# Patient Record
Sex: Female | Born: 1970 | Race: White | Hispanic: No | Marital: Married | Smoking: Never smoker
Health system: Southern US, Community
[De-identification: ages and names within clinical notes are randomized; demographics above are authoritative.]

## PROBLEM LIST (undated history)

## (undated) DIAGNOSIS — Z148 Genetic carrier of other disease: Secondary | ICD-10-CM

## (undated) DIAGNOSIS — Z9889 Other specified postprocedural states: Secondary | ICD-10-CM

## (undated) DIAGNOSIS — E039 Hypothyroidism, unspecified: Secondary | ICD-10-CM

## (undated) DIAGNOSIS — Z87442 Personal history of urinary calculi: Secondary | ICD-10-CM

## (undated) DIAGNOSIS — K5792 Diverticulitis of intestine, part unspecified, without perforation or abscess without bleeding: Secondary | ICD-10-CM

## (undated) DIAGNOSIS — J45909 Unspecified asthma, uncomplicated: Secondary | ICD-10-CM

## (undated) DIAGNOSIS — F419 Anxiety disorder, unspecified: Secondary | ICD-10-CM

## (undated) DIAGNOSIS — N809 Endometriosis, unspecified: Secondary | ICD-10-CM

## (undated) DIAGNOSIS — R519 Headache, unspecified: Secondary | ICD-10-CM

## (undated) DIAGNOSIS — R112 Nausea with vomiting, unspecified: Secondary | ICD-10-CM

## (undated) DIAGNOSIS — F988 Other specified behavioral and emotional disorders with onset usually occurring in childhood and adolescence: Secondary | ICD-10-CM

## (undated) DIAGNOSIS — K769 Liver disease, unspecified: Secondary | ICD-10-CM

## (undated) DIAGNOSIS — Q438 Other specified congenital malformations of intestine: Secondary | ICD-10-CM

## (undated) DIAGNOSIS — E079 Disorder of thyroid, unspecified: Secondary | ICD-10-CM

## (undated) DIAGNOSIS — K269 Duodenal ulcer, unspecified as acute or chronic, without hemorrhage or perforation: Secondary | ICD-10-CM

## (undated) HISTORY — PX: LIVER BIOPSY: SHX301

## (undated) HISTORY — PX: ABDOMINAL SURGERY: SHX537

## (undated) HISTORY — PX: APPENDECTOMY: SHX54

## (undated) HISTORY — PX: ABDOMINAL HYSTERECTOMY: SHX81

## (undated) HISTORY — PX: CHOLECYSTECTOMY: SHX55

---

## 1997-07-02 HISTORY — PX: COLONOSCOPY: SHX5424

## 2006-02-04 ENCOUNTER — Ambulatory Visit: Payer: Self-pay | Admitting: Ophthalmology

## 2006-02-11 ENCOUNTER — Ambulatory Visit: Payer: Self-pay | Admitting: Surgery

## 2006-07-11 ENCOUNTER — Inpatient Hospital Stay (HOSPITAL_COMMUNITY): Admission: AD | Admit: 2006-07-11 | Discharge: 2006-07-11 | Payer: Self-pay | Admitting: Family Medicine

## 2006-07-15 ENCOUNTER — Ambulatory Visit: Payer: Self-pay | Admitting: Gynecology

## 2006-07-19 ENCOUNTER — Encounter: Admission: RE | Admit: 2006-07-19 | Discharge: 2006-07-19 | Payer: Self-pay | Admitting: Gynecology

## 2007-01-20 ENCOUNTER — Emergency Department: Payer: Self-pay

## 2007-01-20 ENCOUNTER — Other Ambulatory Visit: Payer: Self-pay

## 2007-05-23 ENCOUNTER — Ambulatory Visit: Payer: Self-pay | Admitting: Otolaryngology

## 2007-07-30 ENCOUNTER — Encounter: Admission: RE | Admit: 2007-07-30 | Discharge: 2007-07-30 | Payer: Self-pay | Admitting: Neurology

## 2011-04-27 ENCOUNTER — Ambulatory Visit: Payer: Self-pay | Admitting: Gastroenterology

## 2011-05-17 ENCOUNTER — Ambulatory Visit: Payer: Self-pay | Admitting: Surgery

## 2011-05-22 ENCOUNTER — Ambulatory Visit: Payer: Self-pay | Admitting: Surgery

## 2011-05-31 ENCOUNTER — Observation Stay: Payer: Self-pay | Admitting: Surgery

## 2011-10-23 ENCOUNTER — Ambulatory Visit: Payer: Self-pay | Admitting: Family Medicine

## 2011-10-31 ENCOUNTER — Ambulatory Visit: Payer: Self-pay | Admitting: Family Medicine

## 2012-05-21 ENCOUNTER — Ambulatory Visit: Payer: Self-pay | Admitting: Unknown Physician Specialty

## 2012-06-13 ENCOUNTER — Ambulatory Visit: Payer: Self-pay | Admitting: Unknown Physician Specialty

## 2012-10-29 ENCOUNTER — Ambulatory Visit: Payer: Self-pay | Admitting: Unknown Physician Specialty

## 2012-10-30 HISTORY — PX: THYROIDECTOMY: SHX17

## 2012-11-18 ENCOUNTER — Ambulatory Visit: Payer: Self-pay | Admitting: Unknown Physician Specialty

## 2012-11-18 LAB — CALCIUM: Calcium, Total: 8.8 mg/dL (ref 8.5–10.1)

## 2012-12-11 ENCOUNTER — Observation Stay: Payer: Self-pay | Admitting: Internal Medicine

## 2012-12-11 LAB — URINALYSIS, COMPLETE
Bilirubin,UR: NEGATIVE
Blood: NEGATIVE
Blood: NEGATIVE
Glucose,UR: NEGATIVE mg/dL (ref 0–75)
Nitrite: NEGATIVE
Nitrite: NEGATIVE
Ph: 6 (ref 4.5–8.0)
Ph: 6 (ref 4.5–8.0)
RBC,UR: <1 /HPF (ref 0–5)
Squamous Epithelial: 2
Squamous Epithelial: <1

## 2012-12-11 LAB — COMPREHENSIVE METABOLIC PANEL
Albumin: 4 g/dL (ref 3.4–5.0)
Alkaline Phosphatase: 82 U/L (ref 50–136)
Anion Gap: 4 — ABNORMAL LOW (ref 7–16)
Chloride: 105 mmol/L (ref 98–107)
Creatinine: 0.76 mg/dL (ref 0.60–1.30)
Potassium: 4.9 mmol/L (ref 3.5–5.1)
SGOT(AST): 43 U/L — ABNORMAL HIGH (ref 15–37)

## 2012-12-11 LAB — TROPONIN I: Troponin-I: <0.02 ng/mL

## 2012-12-11 LAB — CBC
HGB: 13.6 g/dL (ref 12.0–16.0)
MCH: 30.8 pg (ref 26.0–34.0)
Platelet: 217 10*3/uL (ref 150–440)
RBC: 4.4 10*6/uL (ref 3.80–5.20)
RDW: 12.7 % (ref 11.5–14.5)
WBC: 8.4 10*3/uL (ref 3.6–11.0)

## 2012-12-11 LAB — CK TOTAL AND CKMB (NOT AT ARMC): CK, Total: 65 U/L (ref 21–215)

## 2012-12-12 LAB — MAGNESIUM: Magnesium: 1.9 mg/dL

## 2012-12-31 ENCOUNTER — Other Ambulatory Visit: Payer: Self-pay | Admitting: Unknown Physician Specialty

## 2012-12-31 LAB — MAGNESIUM: Magnesium: 2.2 mg/dL

## 2013-01-20 ENCOUNTER — Other Ambulatory Visit: Payer: Self-pay | Admitting: Unknown Physician Specialty

## 2014-03-22 ENCOUNTER — Ambulatory Visit: Payer: Self-pay | Admitting: Family Medicine

## 2014-04-23 ENCOUNTER — Ambulatory Visit: Payer: Self-pay | Admitting: Gastroenterology

## 2014-04-23 LAB — PROTIME-INR
INR: 1.1
PROTHROMBIN TIME: 13.5 s (ref 11.5–14.7)

## 2014-04-23 LAB — PLATELET COUNT: PLATELETS: 216 10*3/uL (ref 150–440)

## 2014-07-02 HISTORY — PX: COLONOSCOPY: SHX174

## 2014-10-22 NOTE — H&P (Signed)
PATIENT NAME:  Amber Booth, Amber Booth MR#:  [id number] DATE OF BIRTH:  1971/01/21  DATE OF ADMISSION:  12/11/2012  PRIMARY CARE PHYSICIAN: Jerl Mina, MD   CHIEF COMPLAINT: Passed out, had episodes of seizures.   HISTORY OF PRESENT ILLNESS: Amber Booth is a 44 year old white female with past medical history of nephrolithiasis with diagnosis of thyroid nodules with concern about malignancy the patient underwent thyroidectomy 3 weeks back. Since surgery the patient has been feeling dizzy and fatigued with decreased appetite. On Monday the patient got up to go to the bathroom. As she walked felt dizzy, fell down to the floor, body was stiffened. Husband was concern about if the patient had any seizures. The patient regained consciousness back. On Tuesday night, while the patient was at rest, developed similar symptoms of severe dizziness followed by jerking movements in both upper and lower limbs.  Lasted about a few seconds. The patient was unresponsive and confused for a few minutes and returned consciousness. Concerning this the patient was brought to the Emergency Department. Work-up in the Emergency Department with EKG and cardiac enzymes was unremarkable. CT head without contrast did not show any acute intracranial abnormality. The patient's calcium level was 9.3. TSH 2.42. The patient states she has been feeling severe dizziness even at rest. The patient continues to drink fluids, has been tolerating diet well.   PAST MEDICAL HISTORY: 1.  Nephrolithiasis. 2.  Thyroid nodule, status post thyroidectomy.  3.  Cholecystectomy.  4.  Hysterectomy with left salpingo-oophorectomy.  5.  Appendectomy.  6.  Surgery for pituitary microadenoma in 2006.  7.  Gastroesophageal reflux disease. 8.  Anemia. 9.  Migraine headaches.  ALLERGIES:  1.  STADOL.  2.  DEMEROL.  3.  PERCOCET.  4.  NARCOTIC ANALGESICS. 5.  CODEINE.  HOME MEDICATIONS: 1.  Tramadol 50 mg 1 to 2 tablets every 4 hours as needed.  2.  Protonix  20 mg 2 times a day.  3.  Os-Cal 1 tablet 2 times a day.  4.  Levothyroxine 25 mg once a day.  5.  Concerta 37.5 mg orally once a day.   SOCIAL HISTORY: No history of smoking, drinking, alcohol or using illicit drugs. Married.  Lives with her husband. 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  FAMILY HISTORY: Positive for diabetes mellitus, heart disease, clotting problems, hypertension, lung disease, stroke, ovarian cancer and lung cancer.  REVIEW OF SYSTEMS: CONSTITUTIONAL: Severe generalized weakness, fatigue, dizziness.  EYES: No blurred vision.  ENT: No tinnitus. No decreased hearing. No sore throat. RESPIRATORY: No cough, shortness of breath.  CARDIOVASCULAR: No chest pain, palpitations. No pedal edema.  GASTROINTESTINAL: No nausea, vomiting, abdominal pain.  GENITOURINARY: No dysuria or hematuria.  ENDOCRINE: No polyuria or polydipsia.  HEMATOLOGIC: No easy bruising or bleeding.  SKIN: No rash or lesions.  MUSCULOSKELETAL: No joint pains and aches. NEUROLOGIC:  No weakness or numbness in any part of the body.   PHYSICAL EXAMINATION: GENERAL: This is a well-built, well-nourished, age-appropriate female lying down in the bed, not in distress.  VITAL SIGNS: Temperature 97.6, pulse 67, blood pressure 108/71, respiratory rate 20, oxygen saturations 99% on room air.  HEENT: Head normocephalic, atraumatic. There is no sclerae icterus. Conjunctivae normal. Pupils equal and react to light. Mucous membranes moist.  NECK: Supple. No lymphadenopathy. No JVD. No carotid bruit. Surgical scar well healed. LUNGS:  Chest has no focal tenderness. Bilaterally clear to auscultation.  HEART: S1 and S2 regular. No murmurs are heard. No pedal edema. Pulses 2+ in  both lower extremities.  ABDOMEN: Bowel sounds present. Soft, nontender, nondistended. No hepatosplenomegaly.  SKIN: No rash or lesions.  MUSCULOSKELETAL: Good range of motion in all the extremities.  LYMPH:  No axillary or inguinal lymphadenopathy.   NEUROLOGIC: The patient is alert and oriented to place, person and time. Cranial nerves II through XII intact. No nystagmus. Motor 5/5 in upper and lower extremities.   LABORATORY AND DIAGNOSTICS:  CBC: WBC 8.4, hemoglobin 13.6, platelet count 217. CMP is completely within normal limits.   UA negative for nitrites and leukocyte esterase.   CT head without contrast. No acute intracranial abnormality.  Troponin is less than 0.02. TSH is 2.42.   ASSESSMENT AND PLAN: Amber Booth is a 44 year old female status post thyroidectomy 3 weeks back who comes to the Emergency Department with dizziness and questionable episodes of seizures.  1.  Syncope.  Differential diagnosis dehydration from decreased p.o. intake, arrhythmias. EKG is normal sinus rhythm. The patient does not have any electrolyte imbalances at this time. Also orthostatic hypotension. Will check the orthostatic blood pressures. The other possibility of autonomic dysfunction. The patient had history of pituitary tumor resection. There is a possibility that the patient could develop seizures. Admit the patient to the monitored bed. We will obtain echocardiogram, MRI of the brain, EEG.  If these come back to be negative, may also consider getting a tilt table test to evaluate autonomic dysfunction. Will involve physical therapy to evaluate for the patient's vestibular dysfunction. The patient does not have any nystagmus.  2.  Status post thyroidectomy for thyroid nodules.  The patient has pituitary tumor and thyroid nodules.  3.  Nephrolithiasis.  No current issues.  4.  Seizures. Will obtain EEG and follow up.  5.  Keep the patient on deep vein thrombosis prophylaxis with Lovenox. ____________________________ Susa GriffinsPadmaja Miko Markwood, MD pv:sb D: 12/11/2012 08:32:25 ET T: 12/11/2012 09:18:02 ET JOB#: [id number]  cc: Susa GriffinsPadmaja Lucindia Lemley, MD, <Dictator> Rhona LeavensJames F. Burnett ShengHedrick, MD Clerance LavPADMAJA Terrilee Dudzik MD ELECTRONICALLY SIGNED 12/12/2012 8:04

## 2014-10-22 NOTE — Discharge Summary (Signed)
PATIENT NAME:  Amber Booth, Amber Booth MR#:  [id number] DATE OF BIRTH:  Oct 21, 1970  DATE OF ADMISSION:  12/11/2012 DATE OF DISCHARGE:  12/12/2012  PRESENTING COMPLAINT: Syncope and collapse.   DISCHARGE DIAGNOSES: 1.  Seizure disorder, new-onset.  2.  Status post history of total thyroidectomy, secondary to multinodular goiter.   CODE STATUS: FULL CODE.   MEDICATIONS: 1.  Os-Cal/vitamin D (Dictation Anomaly) <<MISSING TEXT>> .  2.  Concerta 54 mg p.o. daily in the morning.  3.  Levoxyl 125 mcg p.o. before breakfast,  4.  Protonix 40 mg at bedtime as needed.  5.  Multivitamin daily.  6.  Tylenol 325 mg, 2 tablets every 4 hours.   DIET: Regular.   1.  Follow up with Dr. Renae FicklePaul, endocrinology, June 26th at 1 p.m., [place] office.  2.  The patient was advised no driving for 6 months.  3.  Follow up with Dr. Burnett ShengHedrick in 1 to 2 weeks.   CONSULTATIONS: [facility]: Dr. Caron Presumeuben.   Magnesium 1.9. TSH 2.  UA negative for UTI.   Cardiac enzymes x 3 negative.   Ionized calcium was 5.1.    EKG showed sinus rhythm with short P-R interval.   Magnesium was 1.8.   Echo Doppler was within normal limits.   MRI of the brain was within normal limits; bifrontal sinusitis.   EEG was reported as within normal limits.   BRIEF SUMMARY OF HOSPITAL COURSE: Rudi HeapKaren Diesel is a 44 year old Caucasian female with no significant past medical history. Recently underwent a total thyroidectomy due to multinodular goiter. The patient came in with:   1.  The patient came in with syncope with collapse: Her husband witnessed both episodes. The patient started having some headache, felt dizzy, passed out and became stiff, had urinary incontinence on one of the episodes. A similar episode repeated again, without incontinence. [facility]y was consulted. Consultation was done with Dr. Caron Presumeuben. He diagnosed the patient had new-onset seizure, etiology unclear, but the patient was recommended to be on Keppra  500 b.i.d. MRI  brain was negative. The patient remained in sinus rhythm. Electrolytes were normal. An EEG was normal as well. She was also recommended not to drive for 6 months.   The patient wanted to wait and see. She was not interested in outpatient neurology second opinion, as well as was not interested in taking any antiseizure medicine.   2. Status post total thyroidectomy for multinodular goiter: Calcium, magnesium, TSH, calcium were normal. The patient was continued on her Synthroid and calcium supplements.  3.  History of migraine headaches: Hospital stay otherwise remained stable.   THE PATIENT REMAINED A FULL CODE.   Time spent: Forty minutes.     ____________________________ Wylie HailSona A. Allena KatzPatel, MD sap:dm D: 12/13/2012 13:14:29 ET T: 12/14/2012 09:16:07 ET JOB#: [id number]  cc: Letisia Schwalb A. Allena KatzPatel, MD, <Dictator> Rhona LeavensJames F. Burnett ShengHedrick, MD Dr. Merla Richesuben Bhakti B. Renae FicklePaul, MD

## 2014-10-22 NOTE — Discharge Summary (Signed)
PATIENT NAME:  Amber Booth, [name] MR#:  [id number] DATE OF BIRTH:  August 21, 1970  DATE OF ADMISSION:  12/11/2012 DATE OF DISCHARGE:  12/12/2012  PRESENTING COMPLAINT: Syncope and collapse.   DISCHARGE DIAGNOSES: 1.  Seizure disorder, new-onset.  2.  Status post history of total thyroidectomy, secondary to multinodular goiter.   CODE STATUS: FULL CODE.   MEDICATIONS: 1.  Os-Cal/vitamin D po daily 2.  Concerta 54 mg p.o. daily in the morning.  3.  Levoxyl 125 mcg p.o. before breakfast,  4.  Protonix 40 mg at bedtime as needed.  5.  Multivitamin daily.  6.  Tylenol 325 mg, 2 tablets every 4 hours.   DIET: Regular.   1.  Follow up with Dr. Renae FicklePaul, endocrinology, June 26th at 1 p.m., [place] office.  2.  The patient was advised no driving for 6 months.  3.  Follow up with Dr. Burnett ShengHedrick in 1 to 2 weeks.   CONSULTATIONS: [facility]: Dr. Caron Presumeuben.   Magnesium 1.9. TSH 2.  UA negative for UTI.   Cardiac enzymes x 3 negative.   Ionized calcium was 5.1.    EKG showed sinus rhythm with short P-R interval.   Magnesium was 1.8.   Echo Doppler was within normal limits.   MRI of the brain was within normal limits; bifrontal sinusitis.   EEG was reported as within normal limits.   BRIEF SUMMARY OF HOSPITAL COURSE: Rudi HeapKaren Girvan is a 44 year old Caucasian female with no significant past medical history. Recently underwent a total thyroidectomy due to multinodular goiter. The patient came in with:   1.  The patient came in with syncope with collapse: Her husband witnessed both episodes. The patient started having some headache, felt dizzy, passed out and became stiff, had urinary incontinence on one of the episodes. A similar episode repeated again, without incontinence. [facility] was consulted. Consultation was done with Dr. Caron Presumeuben. He diagnosed the patient had new-onset seizure, etiology unclear, but the patient was recommended to be on Keppra  500 b.i.d. MRI brain was negative. The patient  remained in sinus rhythm. Electrolytes were normal. An EEG was normal as well. She was also recommended not to drive for 6 months.   The patient wanted to wait and see. She was not interested in outpatient neurology second opinion, as well as was not interested in taking any antiseizure medicine.   2. Status post total thyroidectomy for multinodular goiter: Calcium, magnesium, TSH, calcium were normal. The patient was continued on her Synthroid and calcium supplements.  3.  History of migraine headaches: Hospital stay otherwise remained stable.   THE PATIENT REMAINED A FULL CODE.   Time spent: Forty minutes.     ____________________________ Wylie HailSona A. Allena KatzPatel, MD sap:dm D: 12/13/2012 13:14:00 ET T: 12/14/2012 09:16:07 ET JOB#: [id number]  cc: Rhona LeavensJames F. Burnett ShengHedrick, MD Dr. Merla Richesuben Bhakti B. Renae FicklePaul, MD Ananda Sitzer A. Allena KatzPatel, MD, <Dictator>    Willow OraSONA A Artasia Thang MD ELECTRONICALLY SIGNED 12/24/2012 14:19

## 2014-10-22 NOTE — Op Note (Signed)
PATIENT NAME:  Amber Booth, Amber Booth MR#:  [id number] DATE OF BIRTH:  1970-11-30  DATE OF PROCEDURE:  11/18/2012  PREOPERATIVE DIAGNOSIS: Multinodular goiter.  POSTOPERATIVE DIAGNOSIS: Multinodular goiter.  OPERATION PERFORMED: Total thyroidectomy with laryngeal nerve monitoring.  ATTENDING SURGEON: Amber Pokehapman T. Jery Hollern, MD  ASSISTANT: Amber BealSURGEONColbert Booth: Amber Booth.   OPERATIVE FINDINGS: Multinodular goiter.   DESCRIPTION OF PROCEDURE: Amber BraunKaren was identified in the holding area and taken to the operating room and placed in the supine position. After general endotracheal anesthesia with a laryngeal monitoring device, the neck was gently extended. An incision line was marked along a natural skin crease. A local anesthetic of 1% lidocaine with 1:100,000 units of epinephrine was used to inject along this, a total of 4 mL was used. With the neck prepped and draped sterilely, a 15-blade was used to incise down to and through the platysma muscle. Hemostasis was achieved using the Bovie cautery. The strap muscles were identified and gently divided in the midline. The left lobe of the gland was dissected first. The strap muscles were retracted laterally. There were several small feeding vessels which were divided using the Harmonic scalpel. The superior pole vessels were isolated and again divided by the Harmonic scalpel. The gland was then gently medialized. The superior and inferior parathyroid glands were identified and left intact on their vascular pedicles. As the gland was peeled medially, again, there were several vessels which were divided using the Harmonic scalpel. The recurrent laryngeal nerve was identified in the tracheoesophageal groove. This was stimulated and remained intact throughout the case. With the nerve and both parathyroids identified, the gland was then peeled off the anterior wall of the trachea, releasing Berry's ligament. With the left lobe dissected, the operation then proceeded on the right lobe. Again,  the strap muscles were retracted laterally. The superior pole vessels were isolated and divided using the Harmonic scalpel. As the gland was medialized, the superior and inferior parathyroid glands again were identified and left intact on their vascular pedicles. The recurrent laryngeal nerve was identified in the tracheoesophageal groove. This was also stimulated and remained intact throughout the case. The gland was then medialized and peeled off the anterior tracheal wall. The inferior vessels were divided using the Harmonic scalpel. Berry's ligament was released, and the gland was removed in its entirety from the anterior trachea. A stitch was placed in the right superior pole. The wound was then copiously irrigated with saline. Any small bleeding points were cauterized using the microbipolar. The recurrent laryngeal nerves again were identified and stimulated and remained intact. Two #10 TLS drains were brought out of the wound inferiorly. The strap muscles were reapproximated in the midline using 4-0 Vicryl. The platysmal layer was closed using 4-0 Vicryl. The subcutaneous tissues were closed using 4-0 Vicryl, and the skin was closed using Dermabond. A Tegaderm was placed for the drains. The patient was then returned to anesthesia, where she was awakened in the operating room and taken to recovery room in stable condition.   CULTURES: None.   SPECIMENS: Multinodular goiter.   ESTIMATED BLOOD LOSS: Less than 20 mL.  ____________________________ Amber Pokehapman T. Joniece Smotherman, MD Amber Booth D: 11/18/2012 09:20:04 ET T: 11/18/2012 09:48:44 ET JOB#: [id number]  cc: Amber Pokehapman T. Amber Matos, MD, <Dictator> Amber PokeHAPMAN T Fidelis Loth MD ELECTRONICALLY SIGNED 12/03/2012 8:35

## 2014-10-25 LAB — SURGICAL PATHOLOGY

## 2015-08-10 ENCOUNTER — Other Ambulatory Visit: Payer: Self-pay | Admitting: Internal Medicine

## 2015-08-10 DIAGNOSIS — H53482 Generalized contraction of visual field, left eye: Secondary | ICD-10-CM

## 2015-08-10 DIAGNOSIS — N643 Galactorrhea not associated with childbirth: Secondary | ICD-10-CM

## 2015-08-11 ENCOUNTER — Ambulatory Visit
Admission: RE | Admit: 2015-08-11 | Discharge: 2015-08-11 | Disposition: A | Discharge status: Home / Self Care | Payer: No Typology Code available for payment source | Source: Ambulatory Visit | Attending: Internal Medicine | Admitting: Internal Medicine

## 2015-08-11 DIAGNOSIS — N643 Galactorrhea not associated with childbirth: Secondary | ICD-10-CM | POA: Diagnosis present

## 2015-08-11 DIAGNOSIS — H53482 Generalized contraction of visual field, left eye: Secondary | ICD-10-CM | POA: Insufficient documentation

## 2015-08-11 MED ORDER — GADOBENATE DIMEGLUMINE 529 MG/ML IV SOLN
10.0000 mL | Freq: Once | INTRAVENOUS | Status: AC | PRN
  Administered 2015-08-11: 8 mL via INTRAVENOUS

## 2015-10-07 ENCOUNTER — Other Ambulatory Visit: Payer: Self-pay | Admitting: Ophthalmology

## 2015-10-07 DIAGNOSIS — H5789 Other specified disorders of eye and adnexa: Secondary | ICD-10-CM

## 2015-10-07 DIAGNOSIS — E0500 Thyrotoxicosis with diffuse goiter without thyrotoxic crisis or storm: Secondary | ICD-10-CM

## 2015-10-27 ENCOUNTER — Ambulatory Visit
Admission: RE | Admit: 2015-10-27 | Discharge: 2015-10-27 | Disposition: A | Discharge status: Home / Self Care | Payer: No Typology Code available for payment source | Source: Ambulatory Visit | Attending: Ophthalmology | Admitting: Ophthalmology

## 2015-10-27 DIAGNOSIS — E0500 Thyrotoxicosis with diffuse goiter without thyrotoxic crisis or storm: Secondary | ICD-10-CM | POA: Insufficient documentation

## 2015-10-27 DIAGNOSIS — E079 Disorder of thyroid, unspecified: Secondary | ICD-10-CM

## 2015-10-27 DIAGNOSIS — H539 Unspecified visual disturbance: Secondary | ICD-10-CM | POA: Insufficient documentation

## 2015-10-27 MED ORDER — GADOBENATE DIMEGLUMINE 529 MG/ML IV SOLN
20.0000 mL | Freq: Once | INTRAVENOUS | Status: AC | PRN
  Administered 2015-10-27: 16 mL via INTRAVENOUS

## 2016-02-29 ENCOUNTER — Other Ambulatory Visit
Admission: RE | Admit: 2016-02-29 | Discharge: 2016-02-29 | Disposition: A | Discharge status: Home / Self Care | Payer: Managed Care, Other (non HMO) | Source: Ambulatory Visit | Attending: Unknown Physician Specialty | Admitting: Unknown Physician Specialty

## 2016-02-29 DIAGNOSIS — M791 Myalgia: Secondary | ICD-10-CM | POA: Insufficient documentation

## 2016-02-29 LAB — CK: Total CK: 99 U/L (ref 38–234)

## 2016-03-18 ENCOUNTER — Inpatient Hospital Stay
Admission: EM | Admit: 2016-03-18 | Discharge: 2016-03-20 | DRG: 391 | Disposition: A | Discharge status: Home / Self Care | Payer: Managed Care, Other (non HMO) | Attending: General Surgery | Admitting: General Surgery

## 2016-03-18 ENCOUNTER — Emergency Department: Payer: Managed Care, Other (non HMO)

## 2016-03-18 ENCOUNTER — Encounter: Payer: Self-pay | Admitting: Emergency Medicine

## 2016-03-18 DIAGNOSIS — K5732 Diverticulitis of large intestine without perforation or abscess without bleeding: Principal | ICD-10-CM | POA: Diagnosis present

## 2016-03-18 DIAGNOSIS — K5909 Other constipation: Secondary | ICD-10-CM | POA: Diagnosis present

## 2016-03-18 DIAGNOSIS — Z9049 Acquired absence of other specified parts of digestive tract: Secondary | ICD-10-CM | POA: Diagnosis not present

## 2016-03-18 DIAGNOSIS — Z90710 Acquired absence of both cervix and uterus: Secondary | ICD-10-CM | POA: Diagnosis not present

## 2016-03-18 DIAGNOSIS — Z885 Allergy status to narcotic agent status: Secondary | ICD-10-CM | POA: Diagnosis not present

## 2016-03-18 DIAGNOSIS — R109 Unspecified abdominal pain: Secondary | ICD-10-CM

## 2016-03-18 DIAGNOSIS — E079 Disorder of thyroid, unspecified: Secondary | ICD-10-CM | POA: Diagnosis present

## 2016-03-18 DIAGNOSIS — K831 Obstruction of bile duct: Secondary | ICD-10-CM | POA: Diagnosis present

## 2016-03-18 DIAGNOSIS — K5792 Diverticulitis of intestine, part unspecified, without perforation or abscess without bleeding: Secondary | ICD-10-CM

## 2016-03-18 HISTORY — DX: Diverticulitis of intestine, part unspecified, without perforation or abscess without bleeding: K57.92

## 2016-03-18 HISTORY — DX: Disorder of thyroid, unspecified: E07.9

## 2016-03-18 LAB — URINALYSIS COMPLETE WITH MICROSCOPIC (ARMC ONLY)
BACTERIA UA: NONE SEEN
Bilirubin Urine: NEGATIVE
Glucose, UA: NEGATIVE mg/dL
HGB URINE DIPSTICK: NEGATIVE
Ketones, ur: NEGATIVE mg/dL
LEUKOCYTES UA: NEGATIVE
NITRITE: POSITIVE — AB
PH: 7 (ref 5.0–8.0)
PROTEIN: NEGATIVE mg/dL
Specific Gravity, Urine: 1.005 (ref 1.005–1.030)
WBC UA: NONE SEEN WBC/hpf (ref 0–5)

## 2016-03-18 LAB — COMPREHENSIVE METABOLIC PANEL
ALBUMIN: 4 g/dL (ref 3.5–5.0)
ALT: 536 U/L — ABNORMAL HIGH (ref 14–54)
ANION GAP: 9 (ref 5–15)
AST: 251 U/L — AB (ref 15–41)
Alkaline Phosphatase: 114 U/L (ref 38–126)
BUN: 12 mg/dL (ref 6–20)
CHLORIDE: 104 mmol/L (ref 101–111)
CO2: 24 mmol/L (ref 22–32)
Calcium: 8.8 mg/dL — ABNORMAL LOW (ref 8.9–10.3)
Creatinine, Ser: 0.63 mg/dL (ref 0.44–1.00)
GFR calc Af Amer: >60 mL/min (ref >60)
Glucose, Bld: 96 mg/dL (ref 65–99)
POTASSIUM: 3.3 mmol/L — AB (ref 3.5–5.1)
Sodium: 137 mmol/L (ref 135–145)
TOTAL PROTEIN: 6.9 g/dL (ref 6.5–8.1)
Total Bilirubin: 1.3 mg/dL — ABNORMAL HIGH (ref 0.3–1.2)

## 2016-03-18 LAB — CBC
HEMATOCRIT: 39.9 % (ref 35.0–47.0)
HEMOGLOBIN: 13.7 g/dL (ref 12.0–16.0)
MCH: 30.8 pg (ref 26.0–34.0)
MCHC: 34.3 g/dL (ref 32.0–36.0)
MCV: 89.7 fL (ref 80.0–100.0)
Platelets: 197 10*3/uL (ref 150–440)
RBC: 4.45 MIL/uL (ref 3.80–5.20)
RDW: 13.4 % (ref 11.5–14.5)
WBC: 13.9 10*3/uL — AB (ref 3.6–11.0)

## 2016-03-18 LAB — LIPASE, BLOOD: LIPASE: 25 U/L (ref 11–51)

## 2016-03-18 MED ORDER — MORPHINE SULFATE (PF) 2 MG/ML IV SOLN
2.0000 mg | Freq: Once | INTRAVENOUS | Status: DC
  Filled 2016-03-18: qty 1

## 2016-03-18 MED ORDER — MORPHINE SULFATE (PF) 2 MG/ML IV SOLN
2.0000 mg | Freq: Once | INTRAVENOUS | Status: AC
  Administered 2016-03-18: 2 mg via INTRAVENOUS
  Filled 2016-03-18: qty 1

## 2016-03-18 MED ORDER — MORPHINE SULFATE (PF) 4 MG/ML IV SOLN
4.0000 mg | Freq: Once | INTRAVENOUS | Status: AC
  Administered 2016-03-18: 4 mg via INTRAVENOUS
  Filled 2016-03-18: qty 1

## 2016-03-18 MED ORDER — PIPERACILLIN-TAZOBACTAM 3.375 G IVPB
3.3750 g | Freq: Once | INTRAVENOUS | Status: AC
  Administered 2016-03-18: 3.375 g via INTRAVENOUS
  Filled 2016-03-18: qty 50

## 2016-03-18 MED ORDER — IOPAMIDOL (ISOVUE-300) INJECTION 61%
100.0000 mL | Freq: Once | INTRAVENOUS | Status: AC | PRN
  Administered 2016-03-18: 100 mL via INTRAVENOUS

## 2016-03-18 MED ORDER — SODIUM CHLORIDE 0.9 % IV BOLUS (SEPSIS)
1000.0000 mL | Freq: Once | INTRAVENOUS | Status: AC
  Administered 2016-03-18: 1000 mL via INTRAVENOUS

## 2016-03-18 MED ORDER — IOPAMIDOL (ISOVUE-300) INJECTION 61%
30.0000 mL | Freq: Once | INTRAVENOUS | Status: AC | PRN
  Administered 2016-03-18: 30 mL via ORAL

## 2016-03-18 MED ORDER — ONDANSETRON HCL 4 MG/2ML IJ SOLN
4.0000 mg | Freq: Once | INTRAMUSCULAR | Status: AC
  Administered 2016-03-18: 4 mg via INTRAVENOUS
  Filled 2016-03-18: qty 2

## 2016-03-18 NOTE — ED Notes (Signed)
CT called, pt finished contrast.

## 2016-03-18 NOTE — ED Triage Notes (Deleted)
Pt reports lower quadrant abdominal pain that started yesterday but has worsened since.  Pt states her pain feels similar to how it did when she had an abscess in her abdomen. Pt states she is not passing gas but did have a small BM this morning.  Pt does have a GI doctor.

## 2016-03-18 NOTE — ED Provider Notes (Signed)
[facility] Emergency Department Provider Note   ____________________________________________   First MD Initiated Contact with Patient 03/18/16 1841     (approximate)  I have reviewed the triage vital signs and the nursing notes.   HISTORY  Chief Complaint Abdominal Pain    HPI JACARIA COLBURN is a 45 y.o. female again experiencing left lower quadrant abdominal pain yesterday. Severe, worsening throughout the day, associated with any fever or chills. Reports it feels the same as prior "diverticulitis".  Some nausea but no vomiting. No diarrhea. Reports pain is made much worse by any bump in the car trying to walk. Previous symptoms with similar.  Prior hysterectomy, cholecystectomy, appendectomy.   Past Medical History:  Diagnosis Date  . Diverticulitis   . Thyroid disease     There are no active problems to display for this patient.   Past Surgical History:  Procedure Laterality Date  . ABDOMINAL HYSTERECTOMY    . ABDOMINAL SURGERY    . THYROIDECTOMY      Prior to Admission medications   Not on File    Allergies Codeine; Demerol [meperidine]; and Oxycodone  History reviewed. No pertinent family history.  Social History Social History  Substance Use Topics  . Smoking status: Never Smoker  . Smokeless tobacco: Never Used  . Alcohol use No    Review of Systems Constitutional: No fever/chills Eyes: No visual changes. ENT: No sore throat. Cardiovascular: Denies chest pain. Respiratory: Denies shortness of breath. Gastrointestinal: Severe left lower abdominal pain  No diarrhea.  No constipation. Genitourinary: Negative for dysuria. Musculoskeletal: Negative for back pain. Skin: Negative for rash. Neurological: Negative for headaches, focal weakness or numbness.  10-point ROS otherwise negative.  ____________________________________________   PHYSICAL EXAM:  VITAL SIGNS: ED Triage Vitals [03/18/16 1835]  Enc Vitals  Group     BP 120/76     Pulse Rate 85     Resp 18     Temp 97.6 F (36.4 C)     Temp Source Oral     SpO2 100 %     Weight 171 lb (77.6 kg)     Height 5\' 8"  (1.727 m)     Head Circumference      Peak Flow      Pain Score 9     Pain Loc      Pain Edu?      Excl. in GC?     Constitutional: Alert and oriented. Well appearing But appears in pain, holding her lower abdomen. Eyes: Conjunctivae are normal. PERRL. EOMI. Head: Atraumatic. Nose: No congestion/rhinnorhea. Mouth/Throat: Mucous membranes are moist.  Oropharynx non-erythematous. Neck: No stridor.   Cardiovascular: Normal rate, regular rhythm. Grossly normal heart sounds.  Good peripheral circulation. Respiratory: Normal respiratory effort.  No retractions. Lungs CTAB. Gastrointestinal: Soft and moderate to severe tenderness with rebound peritonitis in the left lower quadrant. Pain to palpation in the right lower quadrant refers pain to the left. Negative Murphy. No upper abdominal pain. No distention. No abdominal bruits. No CVA tenderness. Musculoskeletal: No lower extremity tenderness nor edema.  No joint effusions. Neurologic:  Normal speech and language. No gross focal neurologic deficits are appreciated. Skin:  Skin is warm, dry and intact. No rash noted. Psychiatric: Mood and affect are normal. Speech and behavior are normal.  ____________________________________________   LABS (all labs ordered are listed, but only abnormal results are displayed)  Labs Reviewed  COMPREHENSIVE METABOLIC PANEL - Abnormal; Notable for the following:  Result Value   Potassium 3.3 (*)    Calcium 8.8 (*)    AST 251 (*)    ALT 536 (*)    Total Bilirubin 1.3 (*)    All other components within normal limits  CBC - Abnormal; Notable for the following:    WBC 13.9 (*)    All other components within normal limits  URINALYSIS COMPLETEWITH MICROSCOPIC ([facility] ONLY) - Abnormal; Notable for the following:    Color, Urine AMBER (*)     APPearance CLEAR (*)    Nitrite POSITIVE (*)    Squamous Epithelial / LPF 0-5 (*)    All other components within normal limits  LIPASE, BLOOD   ____________________________________________  EKG   ____________________________________________  RADIOLOGY  Ct Abdomen Pelvis W Contrast  Result Date: 03/18/2016 CLINICAL DATA:  Acute onset of lower abdominal pain. Initial encounter. EXAM: CT ABDOMEN AND PELVIS WITH CONTRAST TECHNIQUE: Multidetector CT imaging of the abdomen and pelvis was performed using the standard protocol following bolus administration of intravenous contrast. CONTRAST:  100mL ISOVUE-300 IOPAMIDOL (ISOVUE-300) INJECTION 61% COMPARISON:  CT of the abdomen and pelvis from 03/22/2014 FINDINGS: Lower chest: The visualized lung bases are grossly clear. The visualized portions of the mediastinum are unremarkable. Hepatobiliary: Intrahepatic biliary duct dilatation likely remains within normal limits status post cholecystectomy, though somewhat more prominent than in 2015. Clips are noted at the gallbladder fossa. The common bile duct is grossly unremarkable. Pancreas: The pancreas is within normal limits. Spleen: The spleen is unremarkable in appearance. Adrenals/Urinary Tract: The adrenal glands are unremarkable in appearance. The kidneys are within normal limits. There is no evidence of hydronephrosis. No renal or ureteral stones are identified. No perinephric stranding is seen. Stomach/Bowel: The stomach is unremarkable in appearance. The small bowel is within normal limits. The appendix is not visualized; there is no evidence for appendicitis. The colon is unremarkable in appearance Mild focal wall thickening is noted along the proximal to mid sigmoid colon, with associated soft tissue inflammation, concerning for an acute infectious or inflammatory colitis. Trace associated free fluid is seen within the pelvis. Vascular/Lymphatic: The abdominal aorta is unremarkable in appearance. The  inferior vena cava is grossly unremarkable. No retroperitoneal lymphadenopathy is seen. No pelvic sidewall lymphadenopathy is identified. Reproductive: The bladder is mildly distended and grossly unremarkable. The patient is status post hysterectomy. No suspicious adnexal masses are seen. The left ovary is unremarkable in appearance. Other: No additional soft tissue abnormalities are seen. Musculoskeletal: No acute osseous abnormalities are identified. The visualized musculature is unremarkable in appearance. IMPRESSION: 1. Mild focal wall thickening along the proximal to mid sigmoid colon, with associated soft tissue inflammation, concerning for an acute infectious or inflammatory colitis. 2. Trace associated free fluid noted within the pelvis. 3. Intrahepatic biliary duct dilatation is likely within normal limits status post cholecystectomy, though somewhat more prominent than in 2015. Electronically Signed   By: Roanna RaiderJeffery  Chang M.D.   On: 03/18/2016 21:33    ____________________________________________   PROCEDURES  Procedure(s) performed: None  Procedures  Critical Care performed: No  ____________________________________________   INITIAL IMPRESSION / ASSESSMENT AND PLAN / ED COURSE  Pertinent labs & imaging results that were available during my care of the patient were reviewed by me and considered in my medical decision making (see chart for details).  Differential diagnosis includes but is not limited to, abdominal perforation, aortic dissection, cholecystitis, appendicitis, diverticulitis, colitis, esophagitis/gastritis, kidney stone, pyelonephritis, urinary tract infection, aortic aneurysm. All are considered in decision and treatment plan.  Based upon the patient's presentation and risk factors, concerned about probable diverticulitis and we'll proceed with CT imaging to evaluate further for causes of pain.  ----------------------------------------- 10:21 PM on  03/18/2016 -----------------------------------------  Patient results appear consistent with diverticulitis, discussed case and patient seen by Dr. Lemar Livings in the ER. Patient will be admitted for pain control, IV antibiotics on the general surgery service.   Clinical Course     ____________________________________________   FINAL CLINICAL IMPRESSION(S) / ED DIAGNOSES  Final diagnoses:  Diverticulitis of sigmoid colon  Intractable abdominal pain      NEW MEDICATIONS STARTED DURING THIS VISIT:  New Prescriptions   No medications on file     Note:  This document was prepared using Dragon voice recognition software and may include unintentional dictation errors.     Sharyn Creamer, MD 03/18/16 2222

## 2016-03-18 NOTE — ED Notes (Signed)
Patient transported to CT 

## 2016-03-18 NOTE — H&P (Signed)
Amber Booth is an 45 y.o. female.   Chief Complaint: Abdominal pain. HPI: 45 y/o female well until 2 nights ago when her husband noted she had some report of mild abdominal discomfort. Well yesterday AM. Wilburn Mylar afternoon she developed pain in the left lower quadrant, similar to what she experienced with and episode of diverticulitis 2 years ago. (Reportedly with abscess not requiring drainage.  Was visiting her daughter in [place], and decided to defer medical assessment until this afternoon. Nausea, dry heaves. No emesis.  Reports long history of " constipation".  Colonoscopy 2 years ago w/ Loistine Simas, MD after diverticulitis episode.  Had been using Miralax and Citracel intermittently to minimize hard stools.  Last BM this AM: scybala like per description. Does not linger in the toilet for her bowel movements.    Past Medical History:  Diagnosis Date  . Diverticulitis   . Thyroid disease     Past Surgical History:  Procedure Laterality Date  . ABDOMINAL HYSTERECTOMY    . ABDOMINAL SURGERY    . THYROIDECTOMY      History reviewed. No pertinent family history. Social History:  reports that she has never smoked. She has never used smokeless tobacco. She reports that she does not drink alcohol. Her drug history is not on file.  Allergies:  Allergies  Allergen Reactions  . Codeine   . Demerol [Meperidine]   . Oxycodone      (Not in a hospital admission)  Results for orders placed or performed during the hospital encounter of 03/18/16 (from the past 48 hour(s))  Lipase, blood     Status: None   Collection Time: 03/18/16  7:20 PM  Result Value Ref Range   Lipase 25 11 - 51 U/L  Comprehensive metabolic panel     Status: Abnormal   Collection Time: 03/18/16  7:20 PM  Result Value Ref Range   Sodium 137 135 - 145 mmol/L   Potassium 3.3 (L) 3.5 - 5.1 mmol/L   Chloride 104 101 - 111 mmol/L   CO2 24 22 - 32 mmol/L   Glucose, Bld 96 65 - 99 mg/dL   BUN 12 6 - 20 mg/dL   Creatinine, Ser 0.63 0.44 - 1.00 mg/dL   Calcium 8.8 (L) 8.9 - 10.3 mg/dL   Total Protein 6.9 6.5 - 8.1 g/dL   Albumin 4.0 3.5 - 5.0 g/dL   AST 251 (H) 15 - 41 U/L   ALT 536 (H) 14 - 54 U/L   Alkaline Phosphatase 114 38 - 126 U/L   Total Bilirubin 1.3 (H) 0.3 - 1.2 mg/dL   GFR calc non Af Amer >60 >60 mL/min   GFR calc Af Amer >60 >60 mL/min    Comment: (NOTE) The eGFR has been calculated using the CKD EPI equation. This calculation has not been validated in all clinical situations. eGFR's persistently <60 mL/min signify possible Chronic Kidney Disease.    Anion gap 9 5 - 15  CBC     Status: Abnormal   Collection Time: 03/18/16  7:20 PM  Result Value Ref Range   WBC 13.9 (H) 3.6 - 11.0 K/uL   RBC 4.45 3.80 - 5.20 MIL/uL   Hemoglobin 13.7 12.0 - 16.0 g/dL   HCT 39.9 35.0 - 47.0 %   MCV 89.7 80.0 - 100.0 fL   MCH 30.8 26.0 - 34.0 pg   MCHC 34.3 32.0 - 36.0 g/dL   RDW 13.4 11.5 - 14.5 %   Platelets 197 150 - 440 K/uL  Urinalysis complete, with microscopic     Status: Abnormal   Collection Time: 03/18/16  7:20 PM  Result Value Ref Range   Color, Urine AMBER (A) YELLOW   APPearance CLEAR (A) CLEAR   Glucose, UA NEGATIVE NEGATIVE mg/dL   Bilirubin Urine NEGATIVE NEGATIVE   Ketones, ur NEGATIVE NEGATIVE mg/dL   Specific Gravity, Urine 1.005 1.005 - 1.030   Hgb urine dipstick NEGATIVE NEGATIVE   pH 7.0 5.0 - 8.0   Protein, ur NEGATIVE NEGATIVE mg/dL   Nitrite POSITIVE (A) NEGATIVE   Leukocytes, UA NEGATIVE NEGATIVE   RBC / HPF 0-5 0 - 5 RBC/hpf   WBC, UA NONE SEEN 0 - 5 WBC/hpf   Bacteria, UA NONE SEEN NONE SEEN   Squamous Epithelial / LPF 0-5 (A) NONE SEEN   Ct Abdomen Pelvis W Contrast  Result Date: 03/18/2016 CLINICAL DATA:  Acute onset of lower abdominal pain. Initial encounter. EXAM: CT ABDOMEN AND PELVIS WITH CONTRAST TECHNIQUE: Multidetector CT imaging of the abdomen and pelvis was performed using the standard protocol following bolus administration of intravenous  contrast. CONTRAST:  152m ISOVUE-300 IOPAMIDOL (ISOVUE-300) INJECTION 61% COMPARISON:  CT of the abdomen and pelvis from 03/22/2014 FINDINGS: Lower chest: The visualized lung bases are grossly clear. The visualized portions of the mediastinum are unremarkable. Hepatobiliary: Intrahepatic biliary duct dilatation likely remains within normal limits status post cholecystectomy, though somewhat more prominent than in 2015. Clips are noted at the gallbladder fossa. The common bile duct is grossly unremarkable. Pancreas: The pancreas is within normal limits. Spleen: The spleen is unremarkable in appearance. Adrenals/Urinary Tract: The adrenal glands are unremarkable in appearance. The kidneys are within normal limits. There is no evidence of hydronephrosis. No renal or ureteral stones are identified. No perinephric stranding is seen. Stomach/Bowel: The stomach is unremarkable in appearance. The small bowel is within normal limits. The appendix is not visualized; there is no evidence for appendicitis. The colon is unremarkable in appearance Mild focal wall thickening is noted along the proximal to mid sigmoid colon, with associated soft tissue inflammation, concerning for an acute infectious or inflammatory colitis. Trace associated free fluid is seen within the pelvis. Vascular/Lymphatic: The abdominal aorta is unremarkable in appearance. The inferior vena cava is grossly unremarkable. No retroperitoneal lymphadenopathy is seen. No pelvic sidewall lymphadenopathy is identified. Reproductive: The bladder is mildly distended and grossly unremarkable. The patient is status post hysterectomy. No suspicious adnexal masses are seen. The left ovary is unremarkable in appearance. Other: No additional soft tissue abnormalities are seen. Musculoskeletal: No acute osseous abnormalities are identified. The visualized musculature is unremarkable in appearance. IMPRESSION: 1. Mild focal wall thickening along the proximal to mid  sigmoid colon, with associated soft tissue inflammation, concerning for an acute infectious or inflammatory colitis. 2. Trace associated free fluid noted within the pelvis. 3. Intrahepatic biliary duct dilatation is likely within normal limits status post cholecystectomy, though somewhat more prominent than in 2015. Electronically Signed   By: JGarald BaldingM.D.   On: 03/18/2016 21:33    Review of Systems  Constitutional: Positive for fever. Negative for chills and diaphoresis.  HENT: Negative.   Eyes: Negative.   Respiratory: Negative.   Cardiovascular: Negative.   Gastrointestinal: Positive for abdominal pain, constipation and nausea. Negative for blood in stool and diarrhea.  Genitourinary: Positive for dysuria.  Musculoskeletal: Positive for myalgias (last week.).  Skin: Negative.        "Bug bite" last week treated with doxycycline.   Neurological: Negative.   Endo/Heme/Allergies:  Negative.   Psychiatric/Behavioral: Negative.     Blood pressure 127/73, pulse 89, temperature 97.6 F (36.4 C), temperature source Oral, resp. rate 20, height _0  (1.727 m), weight 171 lb (77.6 kg), SpO2 100 %. Physical Exam  Constitutional: She appears well-developed and well-nourished.  HENT:  Head: Normocephalic and atraumatic.  Eyes: EOM are normal. Pupils are equal, round, and reactive to light.  Neck:    Cardiovascular: Normal rate and regular rhythm.   Pulses:      Femoral pulses are 2+ on the right side, and 2+ on the left side.      Dorsalis pedis pulses are 2+ on the right side, and 2+ on the left side.       Posterior tibial pulses are 2+ on the right side, and 2+ on the left side.  No peripheral edema.   Respiratory: Effort normal and breath sounds normal.  Able to take deep breath without difficulty.   GI: Normal appearance and bowel sounds are normal. There is tenderness in the left lower quadrant. There is guarding. There is no rigidity and no rebound. Hernia confirmed negative  in the right inguinal area and confirmed negative in the left inguinal area.    Lymphadenopathy:       Right: No inguinal adenopathy present.       Left: No inguinal adenopathy present.  Neurological: She is alert. She has normal strength.  Skin: Skin is warm and dry.  Psychiatric: She has a normal mood and affect. Her speech is normal and behavior is normal. Judgment and thought content normal. Cognition and memory are normal.     Assessment/Plan Diverticulitis. Elevated LFT's. Recent treatment with doxycycline for possible tick exposure.  PLAN  Admit. IV antibiotics. Follow up LFT's with direct bilirubin.  Robert Bellow, MD 03/18/2016, 10:53 PM

## 2016-03-18 NOTE — ED Triage Notes (Signed)
Pt reports lower quadrant abdominal pain that started yesterday but has worsened since.  Pt states her pain feels similar to how it did when she had an abscess in her abdomen. Pt states she is not passing gas but did have a small BM this morning.  Pt does have a GI doctor.  

## 2016-03-19 LAB — HEPATIC FUNCTION PANEL
ALK PHOS: 136 U/L — AB (ref 38–126)
ALT: 583 U/L — AB (ref 14–54)
AST: 321 U/L — AB (ref 15–41)
Albumin: 3.2 g/dL — ABNORMAL LOW (ref 3.5–5.0)
BILIRUBIN DIRECT: 1.3 mg/dL — AB (ref 0.1–0.5)
BILIRUBIN TOTAL: 3.1 mg/dL — AB (ref 0.3–1.2)
Indirect Bilirubin: 1.8 mg/dL — ABNORMAL HIGH (ref 0.3–0.9)
Total Protein: 6 g/dL — ABNORMAL LOW (ref 6.5–8.1)

## 2016-03-19 LAB — CBC WITH DIFFERENTIAL/PLATELET
BASOS ABS: 0 10*3/uL (ref 0–0.1)
Basophils Relative: 0 %
Eosinophils Absolute: 0.1 10*3/uL (ref 0–0.7)
Eosinophils Relative: 1 %
HEMATOCRIT: 35.1 % (ref 35.0–47.0)
Hemoglobin: 12.3 g/dL (ref 12.0–16.0)
LYMPHS ABS: 1.8 10*3/uL (ref 1.0–3.6)
LYMPHS PCT: 23 %
MCH: 31.3 pg (ref 26.0–34.0)
MCHC: 34.9 g/dL (ref 32.0–36.0)
MCV: 89.6 fL (ref 80.0–100.0)
MONO ABS: 1 10*3/uL — AB (ref 0.2–0.9)
Monocytes Relative: 12 %
NEUTROS ABS: 5.1 10*3/uL (ref 1.4–6.5)
Neutrophils Relative %: 64 %
Platelets: 160 10*3/uL (ref 150–440)
RBC: 3.92 MIL/uL (ref 3.80–5.20)
RDW: 13.4 % (ref 11.5–14.5)
WBC: 8.1 10*3/uL (ref 3.6–11.0)

## 2016-03-19 MED ORDER — ENOXAPARIN SODIUM 40 MG/0.4ML ~~LOC~~ SOLN
40.0000 mg | SUBCUTANEOUS | Status: DC
Start: 2016-03-19 — End: 2016-03-19

## 2016-03-19 MED ORDER — CIPROFLOXACIN IN D5W 400 MG/200ML IV SOLN
400.0000 mg | Freq: Two times a day (BID) | INTRAVENOUS | Status: DC
  Administered 2016-03-19 – 2016-03-20 (×2): 400 mg via INTRAVENOUS
  Filled 2016-03-19 (×4): qty 200

## 2016-03-19 MED ORDER — ACETAMINOPHEN 10 MG/ML IV SOLN
1000.0000 mg | Freq: Once | INTRAVENOUS | Status: AC
  Administered 2016-03-19: 1000 mg via INTRAVENOUS
  Filled 2016-03-19: qty 100

## 2016-03-19 MED ORDER — MORPHINE SULFATE (PF) 2 MG/ML IV SOLN
2.0000 mg | INTRAVENOUS | Status: DC | PRN
  Administered 2016-03-19: 2 mg via INTRAVENOUS
  Filled 2016-03-19: qty 1

## 2016-03-19 MED ORDER — THYROID 146.25 MG PO TABS
146.2500 mg | ORAL_TABLET | ORAL | Status: DC
  Filled 2016-03-19: qty 1

## 2016-03-19 MED ORDER — PROMETHAZINE HCL 25 MG/ML IJ SOLN
12.5000 mg | Freq: Four times a day (QID) | INTRAMUSCULAR | Status: DC | PRN
  Administered 2016-03-19 (×2): 12.5 mg via INTRAVENOUS
  Filled 2016-03-19 (×2): qty 1

## 2016-03-19 MED ORDER — TRAMADOL HCL 50 MG PO TABS
100.0000 mg | ORAL_TABLET | ORAL | Status: DC | PRN
  Administered 2016-03-19: 100 mg via ORAL
  Filled 2016-03-19: qty 2

## 2016-03-19 MED ORDER — SODIUM CHLORIDE 0.9 % IV SOLN
1.0000 g | INTRAVENOUS | Status: DC
  Administered 2016-03-19: 1 g via INTRAVENOUS
  Filled 2016-03-19: qty 1

## 2016-03-19 MED ORDER — ENOXAPARIN SODIUM 40 MG/0.4ML ~~LOC~~ SOLN
40.0000 mg | SUBCUTANEOUS | Status: DC
  Filled 2016-03-19: qty 0.4

## 2016-03-19 MED ORDER — ACETAMINOPHEN 325 MG PO TABS: 650.0000 mg | ORAL_TABLET | ORAL | Status: DC | PRN

## 2016-03-19 MED ORDER — TRAMADOL HCL 50 MG PO TABS: 50.0000 mg | ORAL_TABLET | ORAL | Status: DC | PRN

## 2016-03-19 MED ORDER — METRONIDAZOLE IN NACL 5-0.79 MG/ML-% IV SOLN
500.0000 mg | Freq: Three times a day (TID) | INTRAVENOUS | Status: DC
  Administered 2016-03-19 – 2016-03-20 (×3): 500 mg via INTRAVENOUS
  Filled 2016-03-19 (×6): qty 100

## 2016-03-19 MED ORDER — THYROID 146.25 MG PO TABS: 1.0000 | ORAL_TABLET | Freq: Every day | ORAL | Status: DC

## 2016-03-19 MED ORDER — KETOROLAC TROMETHAMINE 30 MG/ML IJ SOLN
30.0000 mg | Freq: Once | INTRAMUSCULAR | Status: AC
  Administered 2016-03-19: 30 mg via INTRAVENOUS
  Filled 2016-03-19: qty 1

## 2016-03-19 MED ORDER — KCL IN DEXTROSE-NACL 40-5-0.9 MEQ/L-%-% IV SOLN
INTRAVENOUS | Status: DC
  Administered 2016-03-19 – 2016-03-20 (×4): via INTRAVENOUS
  Filled 2016-03-19 (×7): qty 1000

## 2016-03-19 MED ORDER — ACETAMINOPHEN 650 MG RE SUPP: 650.0000 mg | Freq: Four times a day (QID) | RECTAL | Status: DC | PRN

## 2016-03-19 NOTE — Progress Notes (Signed)
Contacted pharmacy in regards to PTA med list completion.

## 2016-03-19 NOTE — Progress Notes (Signed)
Remains afebrile. Pain better this afternoon, after Ultram 100 mg had nausea, vomiting (popsicle/ jello) . Better now after phenergan. ABD: Mild distension. Much less LLQ tenderness. Case discussed with Dr. Marva PandaSkulskie.  Changed antibiotics away from agents with cholestasis.  Of note, patient was on doxycyline a few weeks prior to presentation. ( Cholestasis has been associated with doxycycline. )

## 2016-03-19 NOTE — Consult Note (Signed)
GI Inpatient Consult Note Amber Deem MD  Reason for Consult: Abnormal liver testing   Attending Requesting Consult: Dr. Lane Hacker  Outpatient Primary Physician: Dr. Jerl Mina  History of Present Illness: Amber Booth is a 45 y.o. female whom I seen in past the outpatient setting. She has a history of chronic constipation as well as an episode of diverticulitis several years ago. Patient was admitted to the hospital with abdominal pain that began 3 days ago progressively becoming worse until she came to the hospital yesterday. She states that the abdominal discomfort began on Friday followed by increasing constipation with symptoms increasing Saturday and Sunday. She is currently feeling better than on admission, states her 3 out of 10, however she was found to have increasing liver enzymes during the course of her hospitalization.  Since her hospitalization she had some IV Zosyn as well as IV imipenem. Her liver enzymes have increased from yesterday to today including increased elevation of AST ALT alkaline phosphatase as well as bilirubin. Much of the bilirubin is indirect although the total has gone from 1.3 yesterday to 3.1 today.  Patient states that she has a history of abnormal liver associated enzymes and indeed underwent a liver biopsy at the time of her cholecystectomy on 05/22/2011. The liver biopsy at that time was normal. She's also had outpatient evaluation around that time with multiple labs and serologies being done all of which were uninformative in regards to elevated liver enzymes. Patient states that this seems to be a tendency and her family with both her mother and aunt and a brother having a "high liver panel".  She has a known history of diverticulosis/diverticulitis with an EGD and colonoscopy being done on 04/23/2014 these for dyspepsia and abdominal pain showing evidence of gastritis diverticulosis and a somewhat tortuous colonic pathway. Patient has been  treated in the past for Helicobacter pylori.  Liver history-patient's medications have been reviewed outpatient medications are not once that would be related to elevated liver testing under normal circumstances. Rarely drinks alcohol. She has no history of tattoos jaundice or hepatitis in the past. No family history as above. She has never used IV drugs or intranasal cocaine. There is no military foreign travel or incarceration history. She's never had a blood transfusion. She has had both hepatitis A and hepatitis B vaccines.  Past Medical History:  Past Medical History:  Diagnosis Date  . Diverticulitis   . Thyroid disease     Problem List: Patient Active Problem List   Diagnosis Date Noted  . Diverticulitis 03/18/2016    Past Surgical History: Past Surgical History:  Procedure Laterality Date  . ABDOMINAL HYSTERECTOMY    . ABDOMINAL SURGERY    . THYROIDECTOMY      Allergies: Allergies  Allergen Reactions  . Codeine Nausea And Vomiting  . Demerol [Meperidine] Nausea And Vomiting and Other (See Comments)    Also causes blood pressure to drop too low.  . Oxycodone Nausea And Vomiting  . Percocet [Oxycodone-Acetaminophen] Rash    Home Medications: Prescriptions Prior to Admission  Medication Sig Dispense Refill Last Dose  . amphetamine-dextroamphetamine (ADDERALL XR) 20 MG 24 hr capsule Take 20 mg by mouth daily.   03/18/2016 at Unknown time  . calcium carbonate (OSCAL) 1500 (600 Ca) MG TABS tablet Take 600 mg of elemental calcium by mouth every other day. Take on the same days as Magnesium   03/17/2016  . cyanocobalamin (,VITAMIN B-12,) 1000 MCG/ML injection Inject 1 mL into the muscle  every 14 (fourteen) days.  2 03/15/2016 at Unknown time  . Magnesium 500 MG TABS Take 500 mg by mouth every other day. Take the same days as Calcium   03/16/2016  . NATURE-THROID 146.25 MG TABS Take 1 tablet by mouth daily. Skip one day on the weekend  5 03/18/2016 at Unknown time  . omeprazole  (PRILOSEC) 40 MG capsule Take 40 mg by mouth daily.   Past Week at Unknown time   Home medication reconciliation was completed with the patient.   Scheduled Inpatient Medications:   . ciprofloxacin  400 mg Intravenous Q12H  . enoxaparin (LOVENOX) injection  40 mg Subcutaneous Q24H  . metronidazole  500 mg Intravenous Q8H  . [START ON 03/20/2016] Thyroid  146.25 mg Oral Once per day on Sun Tue Wed Thu Fri Sat    Continuous Inpatient Infusions:   . dextrose 5 % and 0.9 % NaCl with KCl 40 mEq/L 100 mL/hr at 03/19/16 1244    PRN Inpatient Medications:  acetaminophen **OR** acetaminophen, morphine injection, promethazine, traMADol  Family History: family history is not on file.   GI Family History: Noted, negative for liver disease, colon cancer or colon polyps.  Social History:   reports that she has never smoked. She has never used smokeless tobacco. She reports that she does not drink alcohol. The patient denies ETOH, tobacco, or drug use.   ROS  Review of Systems: Per admission history and physical agree with same. It is of note that patient states she had some night sweats and some intermittent nausea over the period of the past 2 weeks prior to coming to the hospital with this episode of abdominal pain and diverticulitis. She also was noted to have some mild elevations of transaminases on 02/29/2016 with an AST of 53 a ALT of 128. Her alkaline phosphatase and bilirubins were normal at that time.  Physical Examination: BP (!) 92/50 (BP Location: Right Arm)   Pulse 65   Temp 98 F (36.7 C) (Oral)   Resp 17   Ht 5\' 8"  (1.727 m)   Wt 77.6 kg (171 lb)   SpO2 99%   BMI 26.00 kg/m  Gen: 45 year old female no acute distress HEENT: Normocephalic atraumatic eyes are anicteric Neck: Supple no JVD Chest: Bilaterally clear to auscultation CV: Regular rate and rhythm without rub or gallop Abd: Soft mild generalized discomfort palpation. There is some mild percussive tenderness in  the right upper quadrant. She has a more marked tenderness in the left lower quadrant and across the lower abdomen than above the umbilicus. There no masses or rebound. Bowel sounds are positive normoactive. Ext: No clubbing cyanosis or edema Skin: Warm and dry Other:  Data: Lab Results  Component Value Date   WBC 8.1 03/19/2016   HGB 12.3 03/19/2016   HCT 35.1 03/19/2016   MCV 89.6 03/19/2016   PLT 160 03/19/2016    Recent Labs Lab 03/18/16 1920 03/19/16 0517  HGB 13.7 12.3   Lab Results  Component Value Date   NA 137 03/18/2016   K 3.3 (L) 03/18/2016   CL 104 03/18/2016   CO2 24 03/18/2016   BUN 12 03/18/2016   CREATININE 0.63 03/18/2016   Lab Results  Component Value Date   ALT 583 (H) 03/19/2016   AST 321 (H) 03/19/2016   ALKPHOS 136 (H) 03/19/2016   BILITOT 3.1 (H) 03/19/2016   No results for input(s): APTT, INR, PTT in the last 168 hours. CBC Latest Ref Rng & Units 03/19/2016 03/18/2016  04/23/2014  WBC 3.6 - 11.0 K/uL 8.1 13.9(H) -  Hemoglobin 12.0 - 16.0 g/dL 16.1 09.6 -  Hematocrit 35.0 - 47.0 % 35.1 39.9 -  Platelets 150 - 440 K/uL 160 197 216    STUDIES: Ct Abdomen Pelvis W Contrast  Result Date: 03/18/2016 CLINICAL DATA:  Acute onset of lower abdominal pain. Initial encounter. EXAM: CT ABDOMEN AND PELVIS WITH CONTRAST TECHNIQUE: Multidetector CT imaging of the abdomen and pelvis was performed using the standard protocol following bolus administration of intravenous contrast. CONTRAST:  ISOVUE-300 IOPAMIDOL (ISOVUE-300) INJECTION 61% COMPARISON:  CT of the abdomen and pelvis from 03/22/2014 FINDINGS: Lower chest: The visualized lung bases are grossly clear. The visualized portions of the mediastinum are unremarkable. Hepatobiliary: Intrahepatic biliary duct dilatation likely remains within normal limits status post cholecystectomy, though somewhat more prominent than in 2015. Clips are noted at the gallbladder fossa. The common bile duct is grossly  unremarkable. Pancreas: The pancreas is within normal limits. Spleen: The spleen is unremarkable in appearance. Adrenals/Urinary Tract: The adrenal glands are unremarkable in appearance. The kidneys are within normal limits. There is no evidence of hydronephrosis. No renal or ureteral stones are identified. No perinephric stranding is seen. Stomach/Bowel: The stomach is unremarkable in appearance. The small bowel is within normal limits. The appendix is not visualized; there is no evidence for appendicitis. The colon is unremarkable in appearance Mild focal wall thickening is noted along the proximal to mid sigmoid colon, with associated soft tissue inflammation, concerning for an acute infectious or inflammatory colitis. Trace associated free fluid is seen within the pelvis. Vascular/Lymphatic: The abdominal aorta is unremarkable in appearance. The inferior vena cava is grossly unremarkable. No retroperitoneal lymphadenopathy is seen. No pelvic sidewall lymphadenopathy is identified. Reproductive: The bladder is mildly distended and grossly unremarkable. The patient is status post hysterectomy. No suspicious adnexal masses are seen. The left ovary is unremarkable in appearance. Other: No additional soft tissue abnormalities are seen. Musculoskeletal: No acute osseous abnormalities are identified. The visualized musculature is unremarkable in appearance. IMPRESSION: 1. Mild focal wall thickening along the proximal to mid sigmoid colon, with associated soft tissue inflammation, concerning for an acute infectious or inflammatory colitis. 2. Trace associated free fluid noted within the pelvis. 3. Intrahepatic biliary duct dilatation is likely within normal limits status post cholecystectomy, though somewhat more prominent than in 2015. Electronically Signed   By: Roanna Raider M.D.   On: 03/18/2016 21:33   @IMAGES @  Assessment: 1. Abnormal liver associated enzymes. This is likely multifactorial with contributions  from reactive hepatopathy, medications and (about 6% of patients receiving imipenem will have abnormal liver associated enzymes including cholestasis). She has had extensive liver evaluation the past which was uninformative/normal. Dilated common bile duct noted, status post cholecystectomy. No other liver abnormalities noted on CT scan. 2. Diverticulitis, recurrent. Patient is feeling better than on admission however she continues to have a generalized abdominal discomfort without evidence of surgical abdomen.  Recommendations: 1. Recommend changing IV antibiotics to combination of Cipro and Flagyl 2. Daily LFTs and pro time. 3. If much of this elevation of liver enzymes is related to reactive hepatopathy we will need to wait until her diverticulitis has resolved so that we may do further testing.  Thank you for the consult. Please call with questions or concerns. We'll follow with you.  Amber Deem, MD  03/19/2016 3:18 PM

## 2016-03-19 NOTE — Progress Notes (Signed)
Remains afebrile. BP low overnight w/o associated tachycardia. Awoke from sleep at 5 AM w/ severe pain, nausea, dry heaves. No vomiting. Better after meds. Pain remains in the LLQ. Passing gas, no BM since yesterday AM. Lungs: Clear. Cardio: RR. ABD: Minimal distension, BS normal. Still with significant LLQ tenderness. Extrem: Soft. Labs: WBC: normal without shift. LFT's: Continued rise in transaminases, Bili up to 3.1, (I: 1.8, D: 1.3).  Add on direct bili requested last PM not completed.  IMP: Diverticulitis. Elevated LFT's in patient with past history of same. Dilated CBD on CT 2015 and more so in 2017. Plan: GI consult. Discussed w/ Dr. Marva PandaSkulskie. Continue Invanz. Lovenox as patient is minimally ambulating.

## 2016-03-20 LAB — CBC WITH DIFFERENTIAL/PLATELET
Basophils Absolute: 0 10*3/uL (ref 0–0.1)
Basophils Relative: 1 %
EOS ABS: 0.1 10*3/uL (ref 0–0.7)
Eosinophils Relative: 3 %
HEMATOCRIT: 34.8 % — AB (ref 35.0–47.0)
HEMOGLOBIN: 12 g/dL (ref 12.0–16.0)
LYMPHS ABS: 1.4 10*3/uL (ref 1.0–3.6)
LYMPHS PCT: 35 %
MCH: 31.5 pg (ref 26.0–34.0)
MCHC: 34.6 g/dL (ref 32.0–36.0)
MCV: 91.2 fL (ref 80.0–100.0)
MONOS PCT: 11 %
Monocytes Absolute: 0.5 10*3/uL (ref 0.2–0.9)
NEUTROS ABS: 2 10*3/uL (ref 1.4–6.5)
NEUTROS PCT: 50 %
Platelets: 153 10*3/uL (ref 150–440)
RBC: 3.82 MIL/uL (ref 3.80–5.20)
RDW: 13.6 % (ref 11.5–14.5)
WBC: 4 10*3/uL (ref 3.6–11.0)

## 2016-03-20 LAB — HEPATIC FUNCTION PANEL
ALK PHOS: 141 U/L — AB (ref 38–126)
ALT: 489 U/L — AB (ref 14–54)
AST: 188 U/L — AB (ref 15–41)
Albumin: 3 g/dL — ABNORMAL LOW (ref 3.5–5.0)
BILIRUBIN INDIRECT: 1.2 mg/dL — AB (ref 0.3–0.9)
Bilirubin, Direct: 0.7 mg/dL — ABNORMAL HIGH (ref 0.1–0.5)
TOTAL PROTEIN: 5.8 g/dL — AB (ref 6.5–8.1)
Total Bilirubin: 1.9 mg/dL — ABNORMAL HIGH (ref 0.3–1.2)

## 2016-03-20 LAB — PROTIME-INR
INR: 1.21
Prothrombin Time: 15.4 seconds — ABNORMAL HIGH (ref 11.4–15.2)

## 2016-03-20 NOTE — Progress Notes (Signed)
AVSS. Good night. No further pain, nausea, vomiting. Lungs: Clear. Cardio: RR. ABD: Soft. No residual distension. LLQ tenderness resolved.  Labs:  WBC: 4,000 w/o shift. HGB 12.0. LFTs: Trending down. ALT still >400.  TB down from 3.1 to 1.9, If patient does well with po today, likely d/c this afternoon.

## 2016-03-20 NOTE — Consult Note (Signed)
Subjective: Patient seen for abnormal liver testing in the setting of acute diverticulitis. Overall patient is feeling much better. She denies any abdominal pain or nausea. She is passing flatus she has not had a bowel movement since admission. She is tolerating full liquids.  Objective: Vital signs in last 24 hours: Temp:  [97.9 F (36.6 C)-98 F (36.7 C)] 98 F (36.7 C) (09/19 1229) Pulse Rate:  [61-77] 77 (09/19 1229) Resp:  [12-15] 12 (09/19 1229) BP: (97-112)/(59-61) 112/61 (09/19 1229) SpO2:  [95 %-100 %] 95 % (09/19 1229) Blood pressure 112/61, pulse 77, temperature 98 F (36.7 C), temperature source Oral, resp. rate 12, height 5\' 8"  (1.727 m), weight 77.6 kg (171 lb), SpO2 95 %.   Intake/Output from previous day: 09/18 0701 - 09/19 0700 In: 2540 [P.O.:120; I.V.:2020; IV Piggyback:400] Out: 1450 [Urine:1300; Emesis/NG output:150]  Intake/Output this shift: Total I/O In: 2281 [P.O.:1120; I.V.:1061; IV Piggyback:100] Out: 2600 [Urine:2600]   General appearance:  A 45 year old female no distress, well-appearing. Resp:  Bilaterally clear to auscultation Cardio:  Regular rate and rhythm without rub or gallop GI:  Soft nontender nondistended bowel sounds positive normoactive. Extremities:  No clubbing cyanosis or edema   Lab Results: Results for orders placed or performed during the hospital encounter of 03/18/16 (from the past 24 hour(s))  Hepatic function panel     Status: Abnormal   Collection Time: 03/20/16  5:32 AM  Result Value Ref Range   Total Protein 5.8 (L) 6.5 - 8.1 g/dL   Albumin 3.0 (L) 3.5 - 5.0 g/dL   AST 960 (H) 15 - 41 U/L   ALT 489 (H) 14 - 54 U/L   Alkaline Phosphatase 141 (H) 38 - 126 U/L   Total Bilirubin 1.9 (H) 0.3 - 1.2 mg/dL   Bilirubin, Direct 0.7 (H) 0.1 - 0.5 mg/dL   Indirect Bilirubin 1.2 (H) 0.3 - 0.9 mg/dL  Protime-INR     Status: Abnormal   Collection Time: 03/20/16  5:32 AM  Result Value Ref Range   Prothrombin Time 15.4 (H) 11.4 -  15.2 seconds   INR 1.21   CBC with Differential/Platelet     Status: Abnormal   Collection Time: 03/20/16  5:32 AM  Result Value Ref Range   WBC 4.0 3.6 - 11.0 K/uL   RBC 3.82 3.80 - 5.20 MIL/uL   Hemoglobin 12.0 12.0 - 16.0 g/dL   HCT 45.4 (L) 09.8 - 11.9 %   MCV 91.2 80.0 - 100.0 fL   MCH 31.5 26.0 - 34.0 pg   MCHC 34.6 32.0 - 36.0 g/dL   RDW 14.7 82.9 - 56.2 %   Platelets 153 150 - 440 K/uL   Neutrophils Relative % 50 %   Neutro Abs 2.0 1.4 - 6.5 K/uL   Lymphocytes Relative 35 %   Lymphs Abs 1.4 1.0 - 3.6 K/uL   Monocytes Relative 11 %   Monocytes Absolute 0.5 0.2 - 0.9 K/uL   Eosinophils Relative 3 %   Eosinophils Absolute 0.1 0 - 0.7 K/uL   Basophils Relative 1 %   Basophils Absolute 0.0 0 - 0.1 K/uL      Recent Labs  03/18/16 1920 03/19/16 0517 03/20/16 0532  WBC 13.9* 8.1 4.0  HGB 13.7 12.3 12.0  HCT 39.9 35.1 34.8*  PLT 197 160 153   BMET  Recent Labs  03/18/16 1920  NA 137  K 3.3*  CL 104  CO2 24  GLUCOSE 96  BUN 12  CREATININE 0.63  CALCIUM 8.8*  LFT  Recent Labs  03/20/16 0532  PROT 5.8*  ALBUMIN 3.0*  AST 188*  ALT 489*  ALKPHOS 141*  BILITOT 1.9*  BILIDIR 0.7*  IBILI 1.2*   PT/INR  Recent Labs  03/20/16 0532  LABPROT 15.4*  INR 1.21   Hepatitis Panel No results for input(s): HEPBSAG, HCVAB, HEPAIGM, HEPBIGM in the last 72 hours. C-Diff No results for input(s): CDIFFTOX in the last 72 hours. No results for input(s): CDIFFPCR in the last 72 hours.   Studies/Results: Ct Abdomen Pelvis W Contrast  Result Date: 03/18/2016 CLINICAL DATA:  Acute onset of lower abdominal pain. Initial encounter. EXAM: CT ABDOMEN AND PELVIS WITH CONTRAST TECHNIQUE: Multidetector CT imaging of the abdomen and pelvis was performed using the standard protocol following bolus administration of intravenous contrast. CONTRAST:  100mL ISOVUE-300 IOPAMIDOL (ISOVUE-300) INJECTION 61% COMPARISON:  CT of the abdomen and pelvis from 03/22/2014 FINDINGS:  Lower chest: The visualized lung bases are grossly clear. The visualized portions of the mediastinum are unremarkable. Hepatobiliary: Intrahepatic biliary duct dilatation likely remains within normal limits status post cholecystectomy, though somewhat more prominent than in 2015. Clips are noted at the gallbladder fossa. The common bile duct is grossly unremarkable. Pancreas: The pancreas is within normal limits. Spleen: The spleen is unremarkable in appearance. Adrenals/Urinary Tract: The adrenal glands are unremarkable in appearance. The kidneys are within normal limits. There is no evidence of hydronephrosis. No renal or ureteral stones are identified. No perinephric stranding is seen. Stomach/Bowel: The stomach is unremarkable in appearance. The small bowel is within normal limits. The appendix is not visualized; there is no evidence for appendicitis. The colon is unremarkable in appearance Mild focal wall thickening is noted along the proximal to mid sigmoid colon, with associated soft tissue inflammation, concerning for an acute infectious or inflammatory colitis. Trace associated free fluid is seen within the pelvis. Vascular/Lymphatic: The abdominal aorta is unremarkable in appearance. The inferior vena cava is grossly unremarkable. No retroperitoneal lymphadenopathy is seen. No pelvic sidewall lymphadenopathy is identified. Reproductive: The bladder is mildly distended and grossly unremarkable. The patient is status post hysterectomy. No suspicious adnexal masses are seen. The left ovary is unremarkable in appearance. Other: No additional soft tissue abnormalities are seen. Musculoskeletal: No acute osseous abnormalities are identified. The visualized musculature is unremarkable in appearance. IMPRESSION: 1. Mild focal wall thickening along the proximal to mid sigmoid colon, with associated soft tissue inflammation, concerning for an acute infectious or inflammatory colitis. 2. Trace associated free fluid  noted within the pelvis. 3. Intrahepatic biliary duct dilatation is likely within normal limits status post cholecystectomy, though somewhat more prominent than in 2015. Electronically Signed   By: Roanna RaiderJeffery  Chang M.D.   On: 03/18/2016 21:33    Scheduled Inpatient Medications:   . ciprofloxacin  400 mg Intravenous Q12H  . enoxaparin (LOVENOX) injection  40 mg Subcutaneous Q24H  . metronidazole  500 mg Intravenous Q8H  . Thyroid  146.25 mg Oral Once per day on Sun Tue Wed Thu Fri Sat    Continuous Inpatient Infusions:   . dextrose 5 % and 0.9 % NaCl with KCl 40 mEq/L 100 mL/hr at 03/20/16 1151    PRN Inpatient Medications:  acetaminophen **OR** acetaminophen, morphine injection, promethazine, traMADol  Miscellaneous:   Assessment:  1. Abnormal liver associated enzymes. Multifactorial in nature with reactive hepatopathy and possible cholestatic changes involved with antibiotic use. Slow improvement of LFTs today. Patient has noted that her urine is quite a bit clear. 2. Diverticulitis-improved  Plan:  1. Agree with current management. She will need to have labs drawn tomorrow morning if she stays overnight if she does not stay overnight she'll need to have a liver panel and ProTime drawn on Thursday. She is to follow up in GI in about a week to 10 days. Case discussed with Dr. Lemar Livings.  Christena Deem MD 03/20/2016, 3:44 PM

## 2016-03-20 NOTE — Progress Notes (Signed)
Patient discharge teaching given, including activity, diet, follow-up appoints, and medications. Patient verbalized understanding of all discharge instructions. IV access was d/c'd. Vitals are stable. Skin is intact except as charted in most recent assessments. Pt to be escorted out by NT, to be driven home by family.  Amber Booth  

## 2016-03-20 NOTE — Discharge Instructions (Signed)
Avoid raw fruits and vegetables.  Resume your usual meds including MiraLax.  Follow up with Dr. Marva PandaSkulskie as previously instructed.  Arrange a f/u with Dr. Renda RollsWilton Smith in 2 weeks to review management options with your diverticulitis.

## 2016-03-30 ENCOUNTER — Other Ambulatory Visit
Admission: RE | Admit: 2016-03-30 | Discharge: 2016-03-30 | Disposition: A | Discharge status: Home / Self Care | Payer: Managed Care, Other (non HMO) | Source: Ambulatory Visit | Attending: Gastroenterology | Admitting: Gastroenterology

## 2016-03-30 DIAGNOSIS — R7989 Other specified abnormal findings of blood chemistry: Secondary | ICD-10-CM | POA: Insufficient documentation

## 2016-04-04 ENCOUNTER — Encounter: Payer: Self-pay | Admitting: General Surgery

## 2016-04-08 NOTE — Discharge Summary (Signed)
Physician Discharge Summary  Patient ID: Amber Booth MRN: [id number] DOB/AGE: 07/20/1970 45 y.o.  Admit date: 03/18/2016 Discharge date: 04/08/2016  Admission Diagnoses: Diverticulits, jaundice (cholestatic)  Discharge Diagnoses:  Active Problems:   Diverticulitis   Discharged Condition: good  Hospital Course: Abdominal pain resolved with short course of antibiotics. Elevated LFT's resolving at discharge.  Consults: GI  Significant Diagnostic Studies: labs:    Treatments: antibiotics:    Discharge Exam: Blood pressure 112/61, pulse 77, temperature 98 F (36.7 C), temperature source Oral, resp. rate 12, height 5\' 8"  (1.727 m), weight 171 lb (77.6 kg), SpO2 95 %. Resp: clear to auscultation bilaterally Cardio: regular rate and rhythm, S1, S2 normal, no murmur, click, rub or gallop GI: soft, non-tender; bowel sounds normal; no masses,  no organomegaly  Disposition: 01-Home or Self Care  Discharge Instructions    Diet - low sodium heart healthy    Complete by:  As directed    Discharge instructions    Complete by:  As directed    Follow up with Dr. Katrinka BlazingSmith in 2 weeks.   Follow up with Dr. Marva PandaSkulskie in one week.  Avoid raw fruits and vegetables for the next week.   Increase activity slowly    Complete by:  As directed        Medication List    TAKE these medications   amphetamine-dextroamphetamine 20 MG 24 hr capsule Commonly known as:  ADDERALL XR Take 20 mg by mouth daily.   calcium carbonate 1500 (600 Ca) MG Tabs tablet Commonly known as:  OSCAL Take 600 mg of elemental calcium by mouth every other day. Take on the same days as Magnesium   cyanocobalamin 1000 MCG/ML injection Commonly known as:  (VITAMIN B-12) Inject 1 mL into the muscle every 14 (fourteen) days.   Magnesium 500 MG Tabs Take 500 mg by mouth every other day. Take the same days as Calcium   NATURE-THROID 146.25 MG Tabs Generic drug:  Thyroid Take 1 tablet by mouth daily. Skip one day on the  weekend   omeprazole 40 MG capsule Commonly known as:  PRILOSEC Take 40 mg by mouth daily.        Signed: Earline MayotteByrnett, Dhrithi Riche W 04/08/2016, 6:43 AM

## 2016-04-09 ENCOUNTER — Encounter: Payer: Self-pay | Admitting: *Deleted

## 2016-04-16 ENCOUNTER — Ambulatory Visit (INDEPENDENT_AMBULATORY_CARE_PROVIDER_SITE_OTHER): Payer: Managed Care, Other (non HMO) | Admitting: General Surgery

## 2016-04-16 ENCOUNTER — Encounter: Payer: Self-pay | Admitting: General Surgery

## 2016-04-16 VITALS — BP 122/79 | HR 80 | Resp 12 | Ht 68.0 in | Wt 167.0 lb

## 2016-04-16 DIAGNOSIS — K5732 Diverticulitis of large intestine without perforation or abscess without bleeding: Secondary | ICD-10-CM | POA: Diagnosis not present

## 2016-04-16 DIAGNOSIS — R17 Unspecified jaundice: Secondary | ICD-10-CM | POA: Insufficient documentation

## 2016-04-16 NOTE — Progress Notes (Signed)
Patient ID: Amber JunKaren H Seat, female   DOB: June 09, 1971, 45 y.o.   MRN: [id number]  Chief Complaint  Patient presents with  . Follow-up    diverticulitis    HPI Amber Booth is a 45 y.o. female here for a follow up for diverticulitis. She was seen in patient in the hospital on 03/18/16. She reports some continued nausea. This has been happening for over a year.  She does have some occasional right lower abdominal pain. She has been watching her diet. She has been staying away from salads and spicy foods. She did have a prior episode about a year ago.   HPI  Past Medical History:  Diagnosis Date  . Diverticulitis 03/18/2016  . Thyroid disease     Past Surgical History:  Procedure Laterality Date  . ABDOMINAL HYSTERECTOMY    . ABDOMINAL SURGERY    . APPENDECTOMY    . CHOLECYSTECTOMY    . COLONOSCOPY  2016  . THYROIDECTOMY  10/2012    Family History  Problem Relation Age of Onset  . Asthma Mother   . Hypertension Mother   . Lung cancer Father   . Colon cancer Father   . COPD Father     Social History Social History  Substance Use Topics  . Smoking status: Never Smoker  . Smokeless tobacco: Never Used  . Alcohol use No    Allergies  Allergen Reactions  . Codeine Nausea And Vomiting  . Demerol [Meperidine] Nausea And Vomiting and Other (See Comments)    Also causes blood pressure to drop too low.  . Nortriptyline Other (See Comments)    NIGHTMARES  . Oxycodone Nausea And Vomiting  . Percocet [Oxycodone-Acetaminophen] Rash    Current Outpatient Prescriptions  Medication Sig Dispense Refill  . amphetamine-dextroamphetamine (ADDERALL XR) 20 MG 24 hr capsule Take 20 mg by mouth daily.    . calcium carbonate (OSCAL) 1500 (600 Ca) MG TABS tablet Take 600 mg of elemental calcium by mouth every other day. Take on the same days as Magnesium    . cyanocobalamin (,VITAMIN B-12,) 1000 MCG/ML injection Inject 1 mL into the muscle every 14 (fourteen) days.  2  . Magnesium 500 MG  TABS Take 500 mg by mouth every other day. Take the same days as Calcium    . NATURE-THROID 146.25 MG TABS Take 1 tablet by mouth daily. Skip one day on the weekend  5  . omeprazole (PRILOSEC) 40 MG capsule Take 40 mg by mouth daily.    . polyethylene glycol (MIRALAX / GLYCOLAX) packet Take 17 g by mouth daily.    . SUMAtriptan (IMITREX) 25 MG tablet Take 25 mg by mouth 3 times/day as needed-between meals & bedtime for migraine. May repeat in 2 hours if headache persists or recurs.     No current facility-administered medications for this visit.     Review of Systems Review of Systems  Constitutional: Negative.   Respiratory: Negative.   Cardiovascular: Negative.     Blood pressure 122/79, pulse 80, resp. rate 12, height 5\' 8"  (1.727 m), weight 167 lb (75.8 kg).  Physical Exam Physical Exam  Constitutional: She is oriented to person, place, and time. She appears well-developed and well-nourished.  Eyes: Conjunctivae are normal. No scleral icterus.  Cardiovascular: Normal rate, regular rhythm and normal heart sounds.   Pulmonary/Chest: Effort normal and breath sounds normal.  Abdominal: Soft. Bowel sounds are normal. There is tenderness (left lower quadrant).    Neurological: She is alert and oriented to  person, place, and time.  Skin: Skin is warm and dry.  Psychiatric: She has a normal mood and affect.    Data Reviewed CT of the abdomen and pelvis to of 03/18/2016 obtained of the time of the patient's admission was reviewed. Possible left ovarian cyst. (Status post hysterectomy). Focal changes suggestive of early diverticulitis involving the sigmoid colon.  GI notes from Sharlotte Alamo, NP of 03/29/2016 reviewed. Liver function studies improving. Thought to represent cholestatic jaundice.    Assessment     Sigmoid diverticulitis, resolved.  Intermittent nausea, unclear etiology.    Plan    The patient has had 2 episodes of diverticulitis in the last 2 years, the  first presented with a focal abscess that resolved spontaneously the second with mild changes it responded promptly to IV fluids and a very short course of IV antibiotics.  At this time there is no indication for surgical intervention.  The source for intermittent nausea unrelated to meals, activity or her abdominal pain is difficult to assess. No explanation based on previous imaging studies were today's clinical exam.  The patient did have an upper lower endoscopy in 2015. These images have been requested no be reviewed tomorrow.  No regular dietary restrictions. Offending foods should be avoided. Benefits of a food diary to help keeping track of her symptoms was discussed.        Encouraged patient to keep a food journal. Call the office if symptoms worsen.   This has been scribed by Milas Kocher, CMA.    Earline Mayotte 04/16/2016, 8:45 PM

## 2016-04-16 NOTE — Patient Instructions (Addendum)
Encouraged patient to keep a food journal. Call the office if symptoms worsen.

## 2016-06-01 ENCOUNTER — Other Ambulatory Visit: Payer: Self-pay | Admitting: Family Medicine

## 2016-06-01 DIAGNOSIS — Z1231 Encounter for screening mammogram for malignant neoplasm of breast: Secondary | ICD-10-CM

## 2016-07-11 ENCOUNTER — Other Ambulatory Visit: Payer: Self-pay | Admitting: Family Medicine

## 2016-07-11 ENCOUNTER — Ambulatory Visit
Admission: RE | Admit: 2016-07-11 | Discharge: 2016-07-11 | Disposition: A | Discharge status: Home / Self Care | Payer: Managed Care, Other (non HMO) | Source: Ambulatory Visit | Attending: Family Medicine | Admitting: Family Medicine

## 2016-07-11 DIAGNOSIS — Z1231 Encounter for screening mammogram for malignant neoplasm of breast: Secondary | ICD-10-CM

## 2016-07-11 DIAGNOSIS — N6310 Unspecified lump in the right breast, unspecified quadrant: Secondary | ICD-10-CM | POA: Insufficient documentation

## 2016-07-13 ENCOUNTER — Ambulatory Visit: Payer: Managed Care, Other (non HMO)

## 2016-07-24 ENCOUNTER — Other Ambulatory Visit: Payer: Self-pay | Admitting: Family Medicine

## 2016-07-24 DIAGNOSIS — N6310 Unspecified lump in the right breast, unspecified quadrant: Secondary | ICD-10-CM

## 2016-07-24 DIAGNOSIS — R928 Other abnormal and inconclusive findings on diagnostic imaging of breast: Secondary | ICD-10-CM

## 2016-07-25 ENCOUNTER — Ambulatory Visit
Admission: RE | Admit: 2016-07-25 | Discharge: 2016-07-25 | Disposition: A | Discharge status: Home / Self Care | Payer: Managed Care, Other (non HMO) | Source: Ambulatory Visit | Attending: Family Medicine | Admitting: Family Medicine

## 2016-07-25 ENCOUNTER — Other Ambulatory Visit: Payer: Self-pay | Admitting: Family Medicine

## 2016-07-25 DIAGNOSIS — R928 Other abnormal and inconclusive findings on diagnostic imaging of breast: Secondary | ICD-10-CM | POA: Diagnosis present

## 2016-07-25 DIAGNOSIS — N6310 Unspecified lump in the right breast, unspecified quadrant: Secondary | ICD-10-CM

## 2016-07-25 DIAGNOSIS — N6320 Unspecified lump in the left breast, unspecified quadrant: Secondary | ICD-10-CM | POA: Insufficient documentation

## 2016-07-27 ENCOUNTER — Other Ambulatory Visit: Payer: Self-pay | Admitting: Family Medicine

## 2016-07-27 DIAGNOSIS — N6320 Unspecified lump in the left breast, unspecified quadrant: Secondary | ICD-10-CM

## 2016-07-27 DIAGNOSIS — R928 Other abnormal and inconclusive findings on diagnostic imaging of breast: Secondary | ICD-10-CM

## 2016-07-30 ENCOUNTER — Other Ambulatory Visit: Payer: Self-pay | Admitting: Gastroenterology

## 2016-07-30 DIAGNOSIS — R7989 Other specified abnormal findings of blood chemistry: Secondary | ICD-10-CM

## 2016-07-30 DIAGNOSIS — R945 Abnormal results of liver function studies: Principal | ICD-10-CM

## 2016-07-31 ENCOUNTER — Ambulatory Visit: Payer: Managed Care, Other (non HMO)

## 2016-07-31 ENCOUNTER — Other Ambulatory Visit: Payer: Managed Care, Other (non HMO)

## 2016-08-03 ENCOUNTER — Ambulatory Visit
Admission: RE | Admit: 2016-08-03 | Discharge: 2016-08-03 | Disposition: A | Discharge status: Home / Self Care | Payer: Managed Care, Other (non HMO) | Source: Ambulatory Visit | Attending: Gastroenterology | Admitting: Gastroenterology

## 2016-08-03 DIAGNOSIS — R7989 Other specified abnormal findings of blood chemistry: Secondary | ICD-10-CM

## 2016-08-03 DIAGNOSIS — K838 Other specified diseases of biliary tract: Secondary | ICD-10-CM | POA: Insufficient documentation

## 2016-08-03 DIAGNOSIS — R945 Abnormal results of liver function studies: Secondary | ICD-10-CM

## 2016-08-03 DIAGNOSIS — Z9049 Acquired absence of other specified parts of digestive tract: Secondary | ICD-10-CM | POA: Insufficient documentation

## 2016-08-08 ENCOUNTER — Ambulatory Visit
Admission: RE | Admit: 2016-08-08 | Discharge: 2016-08-08 | Disposition: A | Discharge status: Home / Self Care | Payer: Managed Care, Other (non HMO) | Source: Ambulatory Visit | Attending: Family Medicine | Admitting: Family Medicine

## 2016-08-08 ENCOUNTER — Other Ambulatory Visit: Payer: Self-pay | Admitting: Family Medicine

## 2016-08-08 DIAGNOSIS — N6320 Unspecified lump in the left breast, unspecified quadrant: Secondary | ICD-10-CM

## 2016-08-08 DIAGNOSIS — R928 Other abnormal and inconclusive findings on diagnostic imaging of breast: Secondary | ICD-10-CM

## 2016-08-16 ENCOUNTER — Ambulatory Visit: Admission: RE | Admit: 2016-08-16 | Payer: Managed Care, Other (non HMO) | Source: Ambulatory Visit

## 2016-08-16 ENCOUNTER — Ambulatory Visit
Admission: RE | Admit: 2016-08-16 | Discharge: 2016-08-16 | Disposition: A | Discharge status: Home / Self Care | Payer: Managed Care, Other (non HMO) | Source: Ambulatory Visit | Attending: Family Medicine | Admitting: Family Medicine

## 2016-08-16 ENCOUNTER — Other Ambulatory Visit: Payer: Self-pay | Admitting: Family Medicine

## 2016-08-16 DIAGNOSIS — N6320 Unspecified lump in the left breast, unspecified quadrant: Secondary | ICD-10-CM | POA: Diagnosis not present

## 2016-08-16 DIAGNOSIS — R928 Other abnormal and inconclusive findings on diagnostic imaging of breast: Secondary | ICD-10-CM

## 2017-01-28 ENCOUNTER — Other Ambulatory Visit: Payer: Self-pay | Admitting: Family Medicine

## 2017-01-28 DIAGNOSIS — IMO0002 Reserved for concepts with insufficient information to code with codable children: Secondary | ICD-10-CM

## 2017-01-28 DIAGNOSIS — R229 Localized swelling, mass and lump, unspecified: Principal | ICD-10-CM

## 2017-02-15 ENCOUNTER — Ambulatory Visit
Admission: RE | Admit: 2017-02-15 | Discharge: 2017-02-15 | Disposition: A | Discharge status: Home / Self Care | Payer: Managed Care, Other (non HMO) | Source: Ambulatory Visit | Attending: Family Medicine | Admitting: Family Medicine

## 2017-02-15 ENCOUNTER — Other Ambulatory Visit: Payer: Self-pay | Admitting: Family Medicine

## 2017-02-15 DIAGNOSIS — N6322 Unspecified lump in the left breast, upper inner quadrant: Secondary | ICD-10-CM | POA: Diagnosis not present

## 2017-02-15 DIAGNOSIS — R229 Localized swelling, mass and lump, unspecified: Principal | ICD-10-CM

## 2017-02-15 DIAGNOSIS — IMO0002 Reserved for concepts with insufficient information to code with codable children: Secondary | ICD-10-CM

## 2017-02-18 ENCOUNTER — Other Ambulatory Visit: Payer: Self-pay | Admitting: Family Medicine

## 2017-02-18 DIAGNOSIS — N6320 Unspecified lump in the left breast, unspecified quadrant: Secondary | ICD-10-CM

## 2017-05-28 ENCOUNTER — Emergency Department: Payer: No Typology Code available for payment source

## 2017-05-28 ENCOUNTER — Encounter: Payer: Self-pay | Admitting: Emergency Medicine

## 2017-05-28 ENCOUNTER — Other Ambulatory Visit: Payer: Self-pay

## 2017-05-28 ENCOUNTER — Emergency Department
Admission: EM | Admit: 2017-05-28 | Discharge: 2017-05-28 | Disposition: A | Discharge status: Home / Self Care | Payer: No Typology Code available for payment source | Attending: Emergency Medicine | Admitting: Emergency Medicine

## 2017-05-28 DIAGNOSIS — R079 Chest pain, unspecified: Secondary | ICD-10-CM | POA: Diagnosis not present

## 2017-05-28 DIAGNOSIS — G459 Transient cerebral ischemic attack, unspecified: Secondary | ICD-10-CM

## 2017-05-28 DIAGNOSIS — I959 Hypotension, unspecified: Secondary | ICD-10-CM | POA: Insufficient documentation

## 2017-05-28 DIAGNOSIS — Z79899 Other long term (current) drug therapy: Secondary | ICD-10-CM | POA: Diagnosis not present

## 2017-05-28 DIAGNOSIS — R200 Anesthesia of skin: Secondary | ICD-10-CM | POA: Insufficient documentation

## 2017-05-28 LAB — URINE DRUG SCREEN, QUALITATIVE (ARMC ONLY)
Amphetamines, Ur Screen: NOT DETECTED
BARBITURATES, UR SCREEN: NOT DETECTED
Benzodiazepine, Ur Scrn: NOT DETECTED
CANNABINOID 50 NG, UR ~~LOC~~: NOT DETECTED
COCAINE METABOLITE, UR ~~LOC~~: NOT DETECTED
MDMA (ECSTASY) UR SCREEN: NOT DETECTED
Methadone Scn, Ur: NOT DETECTED
OPIATE, UR SCREEN: NOT DETECTED
Phencyclidine (PCP) Ur S: NOT DETECTED
TRICYCLIC, UR SCREEN: NOT DETECTED

## 2017-05-28 LAB — COMPREHENSIVE METABOLIC PANEL
ALBUMIN: 4.2 g/dL (ref 3.5–5.0)
ALK PHOS: 97 U/L (ref 38–126)
ALT: 245 U/L — AB (ref 14–54)
AST: 68 U/L — AB (ref 15–41)
Anion gap: 10 (ref 5–15)
BUN: 10 mg/dL (ref 6–20)
CALCIUM: 9.2 mg/dL (ref 8.9–10.3)
CHLORIDE: 102 mmol/L (ref 101–111)
CO2: 25 mmol/L (ref 22–32)
CREATININE: 0.77 mg/dL (ref 0.44–1.00)
GFR calc non Af Amer: >60 mL/min (ref >60)
GLUCOSE: 123 mg/dL — AB (ref 65–99)
Potassium: 3.4 mmol/L — ABNORMAL LOW (ref 3.5–5.1)
SODIUM: 137 mmol/L (ref 135–145)
Total Bilirubin: 0.5 mg/dL (ref 0.3–1.2)
Total Protein: 7.5 g/dL (ref 6.5–8.1)

## 2017-05-28 LAB — URINALYSIS, ROUTINE W REFLEX MICROSCOPIC
BILIRUBIN URINE: NEGATIVE
GLUCOSE, UA: NEGATIVE mg/dL
Hgb urine dipstick: NEGATIVE
KETONES UR: NEGATIVE mg/dL
Leukocytes, UA: NEGATIVE
NITRITE: NEGATIVE
PH: 8 (ref 5.0–8.0)
Protein, ur: NEGATIVE mg/dL
SPECIFIC GRAVITY, URINE: 1.003 — AB (ref 1.005–1.030)

## 2017-05-28 LAB — APTT: aPTT: 29 seconds (ref 24–36)

## 2017-05-28 LAB — CBC
HEMATOCRIT: 40.7 % (ref 35.0–47.0)
Hemoglobin: 14 g/dL (ref 12.0–16.0)
MCH: 30.7 pg (ref 26.0–34.0)
MCHC: 34.5 g/dL (ref 32.0–36.0)
MCV: 88.9 fL (ref 80.0–100.0)
PLATELETS: 229 10*3/uL (ref 150–440)
RBC: 4.58 MIL/uL (ref 3.80–5.20)
RDW: 13 % (ref 11.5–14.5)
WBC: 7.2 10*3/uL (ref 3.6–11.0)

## 2017-05-28 LAB — DIFFERENTIAL
BASOS ABS: 0 10*3/uL (ref 0–0.1)
BASOS PCT: 0 %
Eosinophils Absolute: 0 10*3/uL (ref 0–0.7)
Eosinophils Relative: 0 %
LYMPHS PCT: 24 %
Lymphs Abs: 1.7 10*3/uL (ref 1.0–3.6)
MONO ABS: 0.5 10*3/uL (ref 0.2–0.9)
Monocytes Relative: 7 %
NEUTROS ABS: 4.9 10*3/uL (ref 1.4–6.5)
Neutrophils Relative %: 69 %

## 2017-05-28 LAB — TROPONIN I
Troponin I: <0.03 ng/mL (ref <0.03)
Troponin I: <0.03 ng/mL (ref <0.03)

## 2017-05-28 LAB — ETHANOL

## 2017-05-28 LAB — POCT PREGNANCY, URINE: PREG TEST UR: NEGATIVE

## 2017-05-28 LAB — PROTIME-INR
INR: 1
PROTHROMBIN TIME: 13.1 s (ref 11.4–15.2)

## 2017-05-28 MED ORDER — ONDANSETRON HCL 4 MG/2ML IJ SOLN
4.0000 mg | Freq: Once | INTRAMUSCULAR | Status: AC
  Administered 2017-05-28: 4 mg via INTRAVENOUS
  Filled 2017-05-28: qty 2

## 2017-05-28 MED ORDER — ASPIRIN EC 81 MG PO TBEC: 81.0000 mg | DELAYED_RELEASE_TABLET | Freq: Every day | ORAL | 1 refills | Status: AC

## 2017-05-28 MED ORDER — SODIUM CHLORIDE 0.9 % IV BOLUS (SEPSIS)
500.0000 mL | Freq: Once | INTRAVENOUS | Status: AC
  Administered 2017-05-28: 500 mL via INTRAVENOUS

## 2017-05-28 NOTE — ED Provider Notes (Signed)
Patient's MRI and second troponin were negative. Per Dr. Ferne Coeeynold's consult will plan on starting patient on low dose aspirin daily.    Amber Booth, Amber Myren, MD 05/28/17 [phone]

## 2017-05-28 NOTE — ED Triage Notes (Signed)
Pt arrives with [facility] from work with complaints of new-onset left-sided facial numbness, left arm numbness, left eyelid numbness since 12:00 noon. Pt reports finding a chair to sit in when she didn't feel well and asked the doctors at the office to check her out d/t left eyelid numbness. Per EMS, pt blood pressure in 60s systolic at pt's office. Pt has drooping of left side of face, is fatigued and is slurring words slightly, per husband. Pt also complains of chest pain since Saturday but thought it was reflux; chest pain radiated to left arm today. Pt also complaining of jaw pain and nausea. No relief from OTC tums.

## 2017-05-28 NOTE — ED Notes (Signed)
Patient transported to MRI 

## 2017-05-28 NOTE — ED Notes (Signed)
Pt on phone with MRI for screening questions  

## 2017-05-28 NOTE — Consult Note (Signed)
Referring Physician: Alphonzo LemmingsMcShane    Chief Complaint: Left sided numbness and facial droop  HPI: Amber Booth is an 46 y.o. female with a history of hypotension and hypothyroidism who was with her husband today and began to experience a sensation as if her left eye was bulging.  She went to work and while there began to feel poorly.  She was hypotensive and her coworkers noted a left facial droop.  She also felt as if the left side of her face was tingling and her left side felt heavy.  [name].  Initial NIHSS of 2.   Patient has a history of episodic hypotension with associated syncope but no focal neurological symptoms.    Date last known well: Date: 05/28/2017 Time last known well: Time: 12:45 tPA Given: No: Symptoms improving  Past Medical History:  Diagnosis Date  . Diverticulitis 03/18/2016  . Thyroid disease     Past Surgical History:  Procedure Laterality Date  . ABDOMINAL HYSTERECTOMY    . ABDOMINAL SURGERY    . APPENDECTOMY    . CHOLECYSTECTOMY    . COLONOSCOPY  2016  . THYROIDECTOMY  10/2012    Family History  Problem Relation Age of Onset  . Asthma Mother   . Hypertension Mother   . Lung cancer Father   . Colon cancer Father   . COPD Father   . Breast cancer Maternal Aunt [id number]       75  Brother with four strokes before the age of 45.  Social History:  reports that  has never smoked. she has never used smokeless tobacco. She reports that she does not drink alcohol. Her drug history is not on file.  Allergies:  Allergies  Allergen Reactions  . Codeine Nausea And Vomiting  . Demerol [Meperidine] Nausea And Vomiting and Other (See Comments)    Also causes blood pressure to drop too low.  . Doxycycline     Per pt, Dr. Marva PandaSkulskie states "liver function increases" and to avoid  . Nortriptyline Other (See Comments)    NIGHTMARES  . Oxycodone Nausea And Vomiting  . Percocet [Oxycodone-Acetaminophen] Rash    Medications: I have reviewed the  patient's current medications. Prior to Admission:  Prior to Admission medications   Medication Sig Start Date End Date Taking? Authorizing Provider  amphetamine-dextroamphetamine (ADDERALL XR) 20 MG 24 hr capsule Take 20 mg by mouth daily. 03/14/16  Yes [provider]  calcium carbonate (OSCAL) 1500 (600 Ca) MG TABS tablet Take 600 mg of elemental calcium by mouth every other day. Take on the same days as Magnesium   Yes [provider]  cyanocobalamin (,VITAMIN B-12,) 1000 MCG/ML injection Inject 1 mL into the muscle every 14 (fourteen) days. 02/21/16  Yes [provider]  Lactobacillus (PROBIOTIC ACIDOPHILUS) CAPS Take 1 capsule by mouth daily.   Yes [provider]  loratadine (CLARITIN) 10 MG tablet Take 10 mg by mouth daily.   Yes [provider]  Magnesium 500 MG TABS Take 500 mg by mouth every other day. Take the same days as Calcium   Yes [provider]  omeprazole (PRILOSEC) 20 MG capsule Take 20 mg by mouth daily.   Yes [provider]  SUMAtriptan (IMITREX) 100 MG tablet Take 100 mg by mouth as needed for migraine. May repeat in 2 hours if headache persists or recurs.   Yes [provider]  thyroid (ARMOUR) 120 MG tablet Take 120 mg by mouth daily before breakfast.  Yes [provider]    ROS: History obtained from the patient  General ROS: negative for - chills, fatigue, fever, night sweats, weight gain or weight loss Psychological ROS: negative for - behavioral disorder, hallucinations, memory difficulties, mood swings or suicidal ideation Ophthalmic ROS: negative for - blurry vision, double vision, eye pain or loss of vision ENT ROS: dizziness Allergy and Immunology ROS: negative for - hives or itchy/watery eyes Hematological and Lymphatic ROS: negative for - bleeding problems, bruising or swollen lymph nodes Endocrine ROS: negative for - galactorrhea, hair pattern changes, polydipsia/polyuria or  temperature intolerance Respiratory ROS: negative for - cough, hemoptysis, shortness of breath or wheezing Cardiovascular ROS: left chest pain Gastrointestinal ROS: negative for - abdominal pain, diarrhea, hematemesis, nausea/vomiting or stool incontinence Genito-Urinary ROS: negative for - dysuria, hematuria, incontinence or urinary frequency/urgency Musculoskeletal ROS: negative for - joint swelling or muscular weakness Neurological ROS: as noted in HPI Dermatological ROS: negative for rash and skin lesion changes  Physical Examination: Blood pressure 138/85, pulse 92, temperature 98.1 F (36.7 C), temperature source Oral, resp. rate 16, height 5\' 8"  (1.727 m), weight 76.2 kg (168 lb), SpO2 98 %.  HEENT-  Normocephalic, no lesions, without obvious abnormality.  Normal external eye and conjunctiva.  Normal TM's bilaterally.  Normal auditory canals and external ears. Normal external nose, mucus membranes and septum.  Normal pharynx. Cardiovascular- S1, S2 normal, pulses palpable throughout   Lungs- chest clear, no wheezing, rales, normal symmetric air entry Abdomen- soft, non-tender; bowel sounds normal; no masses,  no organomegaly Extremities- no edema Lymph-no adenopathy palpable Musculoskeletal-no joint tenderness, deformity or swelling Skin-warm and dry, no hyperpigmentation, vitiligo, or suspicious lesions  Neurological Examination   Mental Status: Alert, oriented, thought content appropriate.  Speech fluent without evidence of aphasia.  Able to follow 3 step commands without difficulty. Cranial Nerves: II: Discs flat bilaterally; Visual fields grossly normal, pupils equal, round, reactive to light and accommodation III,IV, VI: ptosis not present, extra-ocular motions intact bilaterally V,VII: smile symmetric, facial light touch sensation decreased on the left VIII: hearing normal bilaterally IX,X: gag reflex present XI: bilateral shoulder shrug XII: midline tongue  extension Motor: Right : Upper extremity   5/5    Left:     Upper extremity   5/5  Lower extremity   5/5     Lower extremity   5/5 Tone and bulk:normal tone throughout; no atrophy noted Sensory: Pinprick and light touch intact throughout, bilaterally Deep Tendon Reflexes: 2+ and symmetric throughout Plantars: Right: downgoing   Left: downgoing Cerebellar: normal finger-to-nose, normal rapid alternating movements and normal heel-to-shin test Gait: normal gait and station   Laboratory Studies:  Basic Metabolic Panel: No results for input(s): NA, K, CL, CO2, GLUCOSE, BUN, CREATININE, CALCIUM, MG, PHOS in the last 168 hours.  Liver Function Tests: No results for input(s): AST, ALT, ALKPHOS, BILITOT, PROT, ALBUMIN in the last 168 hours. No results for input(s): LIPASE, AMYLASE in the last 168 hours. No results for input(s): AMMONIA in the last 168 hours.  CBC: Recent Labs  Lab 05/28/17 1411  WBC 7.2  NEUTROABS 4.9  HGB 14.0  HCT 40.7  MCV 88.9  PLT 229    Cardiac Enzymes: No results for input(s): CKTOTAL, CKMB, CKMBINDEX, TROPONINI in the last 168 hours.  BNP: Invalid input(s): POCBNP  CBG: No results for input(s): GLUCAP in the last 168 hours.  Microbiology: No results found for this or any previous visit.  Coagulation Studies: No results for input(s): LABPROT, INR in  the last 72 hours.  Urinalysis: No results for input(s): COLORURINE, LABSPEC, PHURINE, GLUCOSEU, HGBUR, BILIRUBINUR, KETONESUR, PROTEINUR, UROBILINOGEN, NITRITE, LEUKOCYTESUR in the last 168 hours.  Invalid input(s): APPERANCEUR  Lipid Panel: No results found for: CHOL, TRIG, HDL, CHOLHDL, VLDL, LDLCALC  HgbA1C: No results found for: HGBA1C  Urine Drug Screen:  No results found for: LABOPIA, COCAINSCRNUR, LABBENZ, AMPHETMU, THCU, LABBARB  Alcohol Level: No results for input(s): ETH in the last 168 hours.   Imaging: Ct Head Code Stroke Wo Contrast  Result Date: 05/28/2017 CLINICAL DATA:  Code  stroke. 46 y/o F; new onset left-sided facial numbness, arm numbness, and eyelid numbness. EXAM: CT HEAD WITHOUT CONTRAST TECHNIQUE: Contiguous axial images were obtained from the base of the skull through the vertex without intravenous contrast. COMPARISON:  10/27/2015 MRI head.  12/11/2012 CT head. FINDINGS: Brain: No evidence of acute infarction, hemorrhage, hydrocephalus, extra-axial collection or mass lesion/mass effect. Vascular: No hyperdense vessel or unexpected calcification. Skull: Normal. Negative for fracture or focal lesion. Sinuses/Orbits: Partial right frontal sinus opacification. Otherwise negative. Other: None. ASPECTS [facility]([place] Stroke Program Early CT Score) - Ganglionic level infarction (caudate, lentiform nuclei, internal capsule, insula, M1-M3 cortex): 7 - Supraganglionic infarction (M4-M6 cortex): 3 Total score (0-10 with 10 being normal): 10 IMPRESSION: 1. No acute intracranial abnormality identified. Unremarkable stable CT of head. 2. ASPECTS is 10 These results were called by telephone at the time of interpretation on 05/28/2017 at 2:58 pm to Dr. Ileana RoupJAMES MCSHANE , who verbally acknowledged these results. Electronically Signed   By: Mitzi HansenLance  Furusawa-Stratton M.D.   On: 05/28/2017 15:00    Assessment: 46 y.o. female with a history of hypotension and hypothyroidism who presents with an episode of hypotension with associated focal symptoms of left sided numbness and facial droop.  Symptoms improving.  Likely secondary to hypotension and focal hypoperfusion.  Head CT reviewed and shows no acute changes.    Stroke Risk Factors - none  Plan: 1. HgbA1c, fasting lipid panel 2. MRI of the brain without contrast.  If unremarkable no further neurological work up recommended at this time.  If acute infarct noted would admit for stroke work up.   3. Prophylactic therapy-Antiplatelet med: Aspirin - dose 81mg  daily 4. Telemetry monitoring 5. Frequent neuro checks   Case discussed with Dr.  Lianne BushyMcShane  Lucill Mauck, MD Neurology [phone] 05/28/2017, 3:16 PM

## 2017-05-28 NOTE — ED Notes (Signed)
Pt ambulatory upon discharge. Pt and husband verbalized understanding of discharge instructions, prescription and follow-up care. A&O x4. Skin warm and dry. VSS.

## 2017-05-28 NOTE — ED Provider Notes (Addendum)
[facility] Emergency Department Provider Note  ____________________________________________   I have reviewed the triage vital signs and the nursing notes.   HISTORY  Chief Complaint Near Syncope    HPI Amber Booth is a 46 y.o. female who is largely healthy except for diverticulitis and multiple different surgeries in the past including thyroid cholecystectomy appendectomy hysterectomy in the past.  She states that she does have episodes of low blood pressure recurrently for years, and often she will find her blood pressure to be in the 70s.  This is happened to her often during medical procedures.  She states that today she was in her normal state of health she has had some reflux disease over the last couple days which is not unusual for her but no chest pain or shortness of breath.  Today however, she felt very weak and unwell and then declared that around    Past Medical History:  Diagnosis Date  . Diverticulitis 03/18/2016  . Thyroid disease     Patient Active Problem List   Diagnosis Date Noted  . Diverticulitis of colon 04/16/2016  . 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 ABDOMINAL HYSTERECTOMY    . ABDOMINAL SURGERY    . APPENDECTOMY    . CHOLECYSTECTOMY    . COLONOSCOPY  2016  . THYROIDECTOMY  10/2012    Prior to Admission medications   Medication Sig Start Date End Date Taking? Authorizing Provider  amphetamine-dextroamphetamine (ADDERALL XR) 20 MG 24 hr capsule Take 20 mg by mouth daily. 03/14/16   [provider]  calcium carbonate (OSCAL) 1500 (600 Ca) MG TABS tablet Take 600 mg of elemental calcium by mouth every other day. Take on the same days as Magnesium    [provider]  cyanocobalamin (,VITAMIN B-12,) 1000 MCG/ML injection Inject 1 mL into the muscle every 14 (fourteen) days. 02/21/16   [provider]  Magnesium 500 MG TABS Take 500 mg by mouth every  other day. Take the same days as Calcium    [provider]  NATURE-THROID 146.25 MG TABS Take 1 tablet by mouth daily. Skip one day on the weekend 03/10/16   [provider]  omeprazole (PRILOSEC) 40 MG capsule Take 40 mg by mouth daily.    [provider]  polyethylene glycol (MIRALAX / GLYCOLAX) packet Take 17 g by mouth daily.    [provider]  SUMAtriptan (IMITREX) 25 MG tablet Take 25 mg by mouth 3 times/day as needed-between meals & bedtime for migraine. May repeat in 2 hours if headache persists or recurs.    [provider]    Allergies Codeine; Demerol [meperidine]; Doxycycline; Nortriptyline; Oxycodone; and Percocet [oxycodone-acetaminophen]  Family History  Problem Relation Age of Onset  . Asthma Mother   . Hypertension Mother   . Lung cancer Father   . Colon cancer Father   . COPD Father   . Breast cancer Maternal Aunt 63       75    Social History Social History   Tobacco Use  . Smoking status: Never Smoker  . Smokeless tobacco: Never Used  Substance Use Topics  . Alcohol use: No  . Drug use: Not on file    Review of Systems Constitutional: No fever/chills Eyes: No visual changes. ENT: No sore throat. No stiff neck no neck pain Cardiovascular: Denies chest pain. Respiratory: Denies shortness of breath. Gastrointestinal:   no vomiting.  No diarrhea.  No  constipation. Genitourinary: Negative for dysuria. Musculoskeletal: Negative lower extremity swelling Skin: Negative for rash. Neurological: Negative for severe headaches, focal weakness or numbness.   ____________________________________________   PHYSICAL EXAM:  VITAL SIGNS: ED Triage Vitals  Enc Vitals Group     BP 05/28/17 1417 138/85     Pulse Rate 05/28/17 1417 92     Resp 05/28/17 1417 16     Temp 05/28/17 1417 98.1 F (36.7 C)     Temp Source 05/28/17 1417 Oral     SpO2 05/28/17 1417 98 %     Weight 05/28/17 1417 168 lb (76.2 kg)     Height  05/28/17 1417 5\' 8"  (1.727 m)     Head Circumference --      Peak Flow --      Pain Score 05/28/17 1416 0     Pain Loc --      Pain Edu? --      Excl. in [place]? --     Constitutional: Alert and oriented. Well appearing and in no acute distress. Eyes: Conjunctivae are normal Head: Atraumatic HEENT: No congestion/rhinnorhea. Mucous membranes are moist.  Oropharynx non-erythematous Neck:   Nontender with no meningismus, no masses, no stridor Cardiovascular: Normal rate, regular rhythm. Grossly normal heart sounds.  Good peripheral circulation. Respiratory: Normal respiratory effort.  No retractions. Lungs CTAB. Abdominal: Soft and nontender. No distention. No guarding no rebound Back:  There is no focal tenderness or step off.  there is no midline tenderness there are no lesions noted. there is no CVA tenderness Musculoskeletal: No lower extremity tenderness, no upper extremity tenderness. No joint effusions, no DVT signs strong distal pulses no edema Neurologic: Cranial nerves II through XII are grossly intact 5 out of 5 strength bilateral upper and lower extremity. Finger to nose within normal limits heel to shin within normal limits, speech is normal with no word finding difficulty or dysarthria, reflexes symmetric, pupils are equally round and reactive to light, there is no pronator drift, sensation is normal, vision is intact to confrontation, gait is deferred, there is no nystagmus, normal neurologic exam Skin:  Skin is warm, dry and intact. No rash noted. Psychiatric: Mood and affect are anxious. Speech and behavior are normal.  ____________________________________________   LABS (all labs ordered are listed, but only abnormal results are displayed)  Labs Reviewed  ETHANOL  PROTIME-INR  APTT  CBC  DIFFERENTIAL  COMPREHENSIVE METABOLIC PANEL  TROPONIN I  URINE DRUG SCREEN, QUALITATIVE ([facility] ONLY)  URINALYSIS, ROUTINE W REFLEX MICROSCOPIC  POC URINE PREG, ED    Pertinent labs   results that were available during my care of the patient were reviewed by me and considered in my medical decision making (see chart for details). ____________________________________________  EKG  I personally interpreted any EKGs ordered by me or triage Sinus rhythm 89 no acute ST elevation no acute ST depression normal axis ____________________________________________  RADIOLOGY  Pertinent labs & imaging results that were available during my care of the patient were reviewed by me and considered in my medical decision making (see chart for details). If possible, patient and/or family made aware of any abnormal findings.  No results found. ____________________________________________    PROCEDURES  Procedure(s) performed: None  Procedures  Critical Care performed: None  ____________________________________________   INITIAL IMPRESSION / ASSESSMENT AND PLAN / ED COURSE  Pertinent labs & imaging results that were available during my care of the patient were reviewed by me and considered in my medical decision making (see chart  for details).  Patient here with left-sided neurologic symptoms which my exam are resolved.  However I did call a code stroke, patient states she has some residual tingling which I cannot elicit and is very subjective.  To my exam she has an NIH stroke scale 0, she was seen by Dr. Thad Rangereynolds of neurology who does not feel that this is going to likely require TPA but she does feel that an MRI is indicated and if negative patient can go home on aspirin.  CT scan of the head is negative.  The patient if she did have worsening symptoms prior to arrival certainly has rapidly improving symptoms as at this time I cannot elicit any focal neurologic deficit.  When she speaks to me she gesticulates with her left hand with no difficulties when she smiles she has a symmetric face etc.  Patient does have a chronic history of intermittent low blood pressure which has been  well documented and seen multiple times.  We will refer her as an outpatient for that, she had some reflux disease earlier we will check troponins.  At this time, there does not appear to be clinical evidence to support the diagnosis of pulmonary embolus, dissection, myocarditis, endocarditis, pericarditis, pericardial tamponade, acute coronary syndrome, pneumothorax, pneumonia, or any other acute intrathoracic pathology that will require admission or acute intervention. Nor is there evidence of any significant intra-abdominal pathology causing this discomfort.  We will have her follow closely with primary care, she was signed out at the end of my shift to Dr. Derrill KayGoodman  ----------------------------------------- 3:50 PM on 05/28/2017 -----------------------------------------  States her subjective tingling in her face is completely gone at this time she has no neurologic complaints or symptoms or signs, CT is negative, MRI is pending, patient in no acute distress she has no pain of any variety NIH stroke scale remains 0.    ____________________________________________   FINAL CLINICAL IMPRESSION(S) / ED DIAGNOSES  Final diagnoses:  None      This chart was dictated using voice recognition software.  Despite best efforts to proofread,  errors can occur which can change meaning.      Jeanmarie PlantMcShane, James A, MD 05/28/17 1523    Jeanmarie PlantMcShane, James A, MD 05/28/17 1550

## 2017-05-28 NOTE — Discharge Instructions (Signed)
Please seek medical attention for any high fevers, chest pain, shortness of breath, change in behavior, persistent vomiting, bloody stool or any other new or concerning symptoms.  

## 2017-05-28 NOTE — ED Notes (Signed)
Patient back in room from MRI.

## 2017-07-22 ENCOUNTER — Other Ambulatory Visit
Admission: RE | Admit: 2017-07-22 | Discharge: 2017-07-22 | Disposition: A | Discharge status: Home / Self Care | Payer: 59 | Source: Ambulatory Visit | Attending: Ophthalmology | Admitting: Ophthalmology

## 2017-07-22 DIAGNOSIS — H02402 Unspecified ptosis of left eyelid: Secondary | ICD-10-CM | POA: Diagnosis not present

## 2017-07-26 LAB — ACETYLCHOLINE RECEPTOR AB, ALL: Acetylchol Block Ab: 20 % (ref 0–25)

## 2017-08-02 ENCOUNTER — Other Ambulatory Visit: Payer: Self-pay | Admitting: Family Medicine

## 2017-08-02 DIAGNOSIS — N6320 Unspecified lump in the left breast, unspecified quadrant: Secondary | ICD-10-CM

## 2017-08-15 ENCOUNTER — Ambulatory Visit
Admission: RE | Admit: 2017-08-15 | Discharge: 2017-08-15 | Disposition: A | Discharge status: Home / Self Care | Payer: 59 | Source: Ambulatory Visit | Attending: Family Medicine | Admitting: Family Medicine

## 2017-08-15 ENCOUNTER — Other Ambulatory Visit: Payer: Self-pay | Admitting: Family Medicine

## 2017-08-15 DIAGNOSIS — N6320 Unspecified lump in the left breast, unspecified quadrant: Secondary | ICD-10-CM

## 2017-08-15 DIAGNOSIS — N6324 Unspecified lump in the left breast, lower inner quadrant: Secondary | ICD-10-CM | POA: Insufficient documentation

## 2018-05-12 ENCOUNTER — Other Ambulatory Visit: Payer: Self-pay | Admitting: Family Medicine

## 2018-05-12 DIAGNOSIS — N6320 Unspecified lump in the left breast, unspecified quadrant: Secondary | ICD-10-CM

## 2018-08-19 ENCOUNTER — Ambulatory Visit
Admission: RE | Admit: 2018-08-19 | Discharge: 2018-08-19 | Disposition: A | Discharge status: Home / Self Care | Payer: 59 | Source: Ambulatory Visit | Attending: Family Medicine | Admitting: Family Medicine

## 2018-08-19 DIAGNOSIS — N6320 Unspecified lump in the left breast, unspecified quadrant: Secondary | ICD-10-CM | POA: Diagnosis not present

## 2018-11-27 IMAGING — MR MR HEAD W/O CM
10 series · 48 of 48 positions shown · non-contrast
Comparison: 10/27/2015

CLINICAL DATA: Focal neuro deficit for less than 6 hours, stroke
suspected. Generalized weakness.

EXAM:
MRI HEAD WITHOUT CONTRAST
TECHNIQUE: Multiplanar, multiecho pulse sequences of the brain and surrounding
structures were obtained without intravenous contrast.

[Series 3: DWI · axial · 3.0mm · 1.80mm/px · z∈[-93,+68]mm · 7 of 55 slices shown (1 of 2)]
[im 1/55]
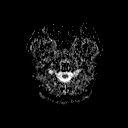
[im 10/55]
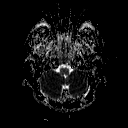
[im 19/55]
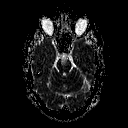
[im 28/55]
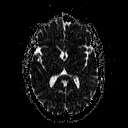
[im 37/55]
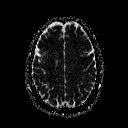
[im 46/55]
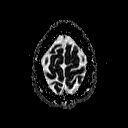
[im 55/55]
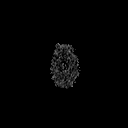

[Series 5: DWI · coronal · 3.0mm · 1.80mm/px · 6 of 47 slices shown (2 of 2)]
[im 1/47]
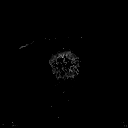
[im 10/47]
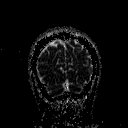
[im 19/47]
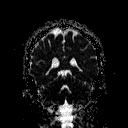
[im 28/47]
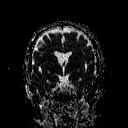
[im 37/47]
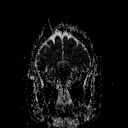
[im 47/47]
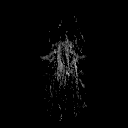

[Series 6: T1 · sagittal · 5.0mm · 0.45mm/px · 3 of 29 slices shown (1 of 2)]
[im 1/29]
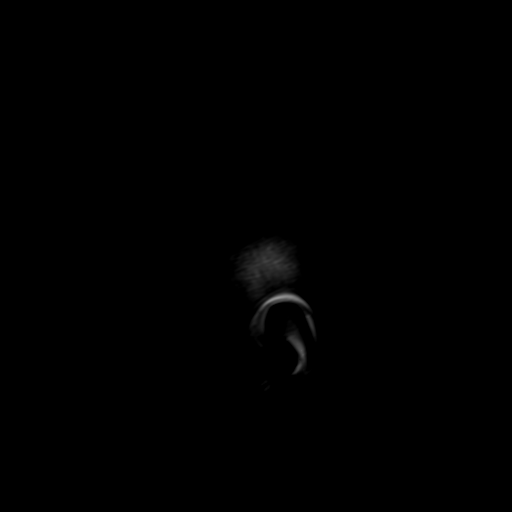
[im 15/29]
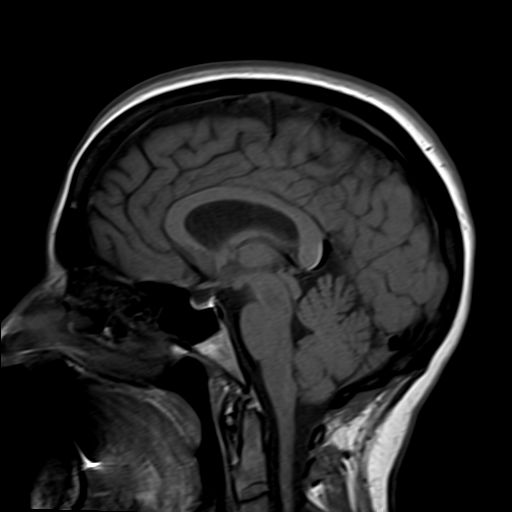
[im 29/29]
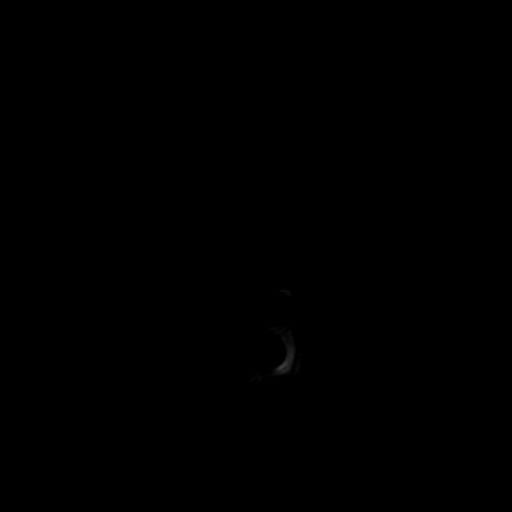

[Series 7: T2 · axial · 5.0mm · 0.90mm/px · z∈[-96,+72]mm · 3 of 27 slices shown (1 of 3)]
[im 1/27]
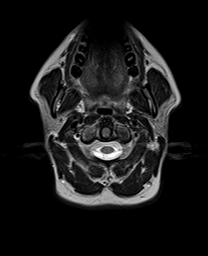
[im 14/27]
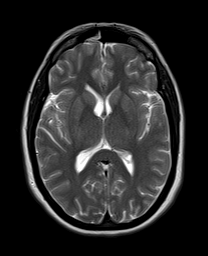
[im 27/27]
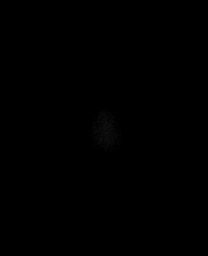

[Series 8: FLAIR · axial · 5.0mm · 0.45mm/px · z∈[-96,+72]mm · 3 of 27 slices shown]
[im 1/27]
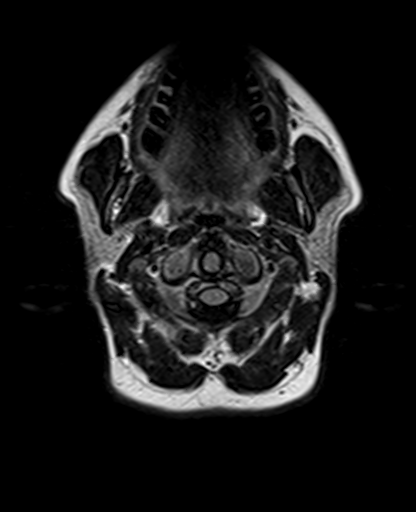
[im 14/27]
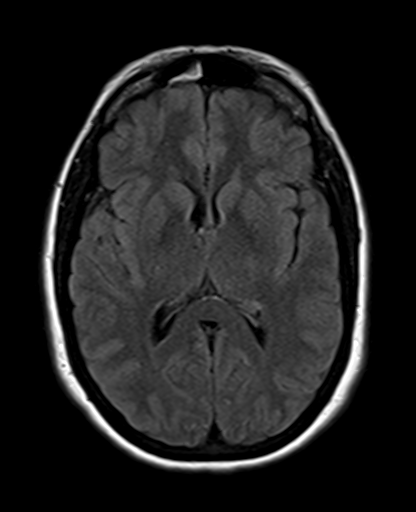
[im 27/27]
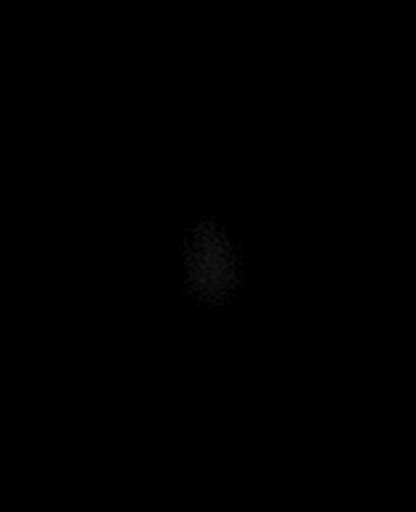

[Series 9: T2 · axial · 5.0mm · 0.45mm/px · z∈[-96,+72]mm · 3 of 27 slices shown (2 of 3)]
[im 1/27]
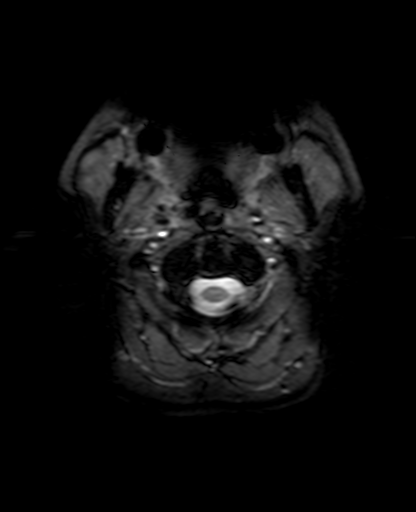
[im 14/27]
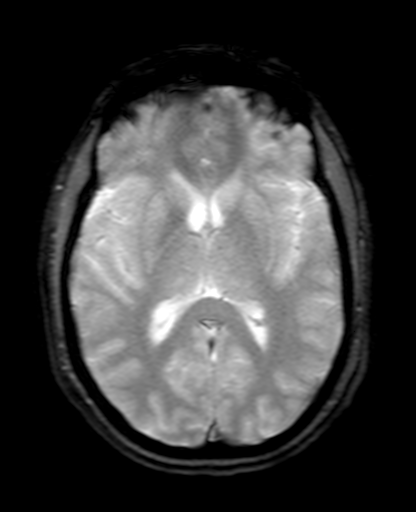
[im 27/27]
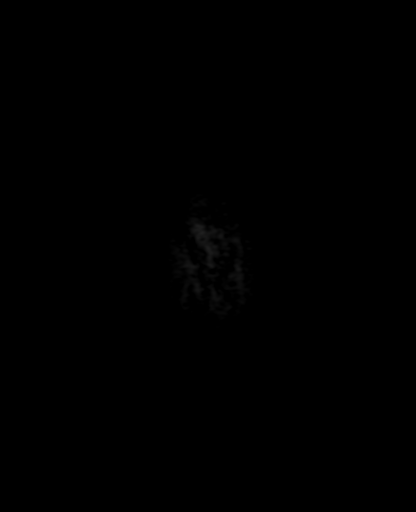

[Series 10: T1 · axial · 3.0mm · 1.00mm/px · z∈[-93,+71]mm · 7 of 56 slices shown (2 of 2)]
[im 1/56]
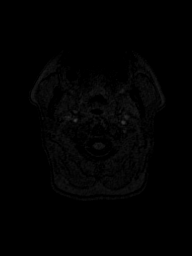
[im 10/56]
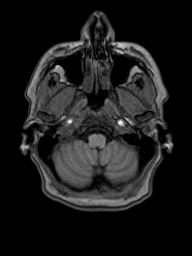
[im 19/56]
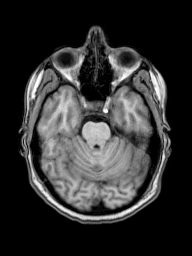
[im 28/56]
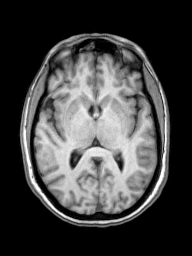
[im 37/56]
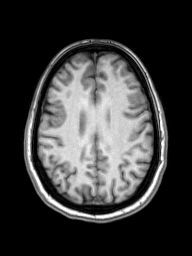
[im 46/56]
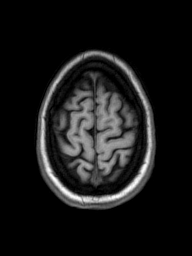
[im 56/56]
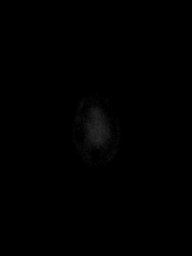

[Series 11: T2 · coronal · 5.0mm · 0.86mm/px · 3 of 29 slices shown (3 of 3)]
[im 1/29]
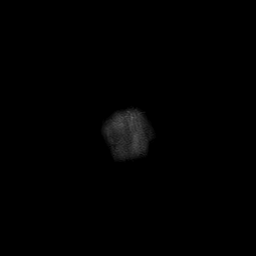
[im 15/29]
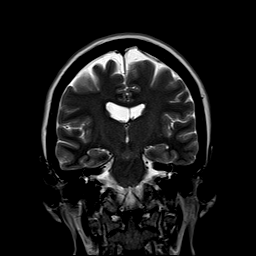
[im 29/29]
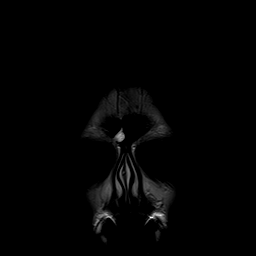

[Series 100: ax (id) · axial · 3.0mm · 1.80mm/px · z∈[-93,+68]mm · 7 of 55 slices shown]
[im 1/55]
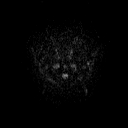
[im 10/55]
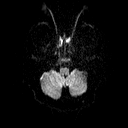
[im 19/55]
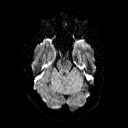
[im 28/55]
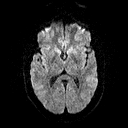
[im 37/55]
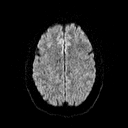
[im 46/55]
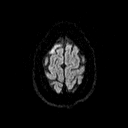
[im 55/55]
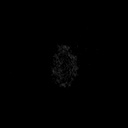

[Series 101: cor (id) · coronal · 3.0mm · 1.80mm/px · 6 of 46 slices shown]
[im 1/46]
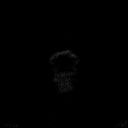
[im 10/46]
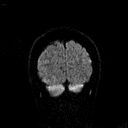
[im 19/46]
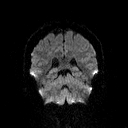
[im 28/46]
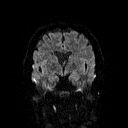
[im 37/46]
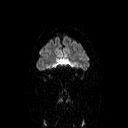
[im 46/46]
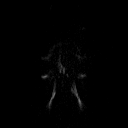

[48 of 48 positions shown; findings below may reference images not displayed]

FINDINGS: Brain: No acute infarction, hemorrhage, hydrocephalus, extra-axial
collection or mass lesion. No white matter disease or atrophy.
Developmental asymmetry of the lateral ventricles, incidental.

Vascular: Major flow voids are preserved.

Skull and upper cervical spine: Negative

Sinuses/Orbits: Minimal opacification of left maxillary sinus.
Frothy appearing secretions in the right frontal sinus.
IMPRESSION: Negative exam.  No explanation for weakness.

## 2019-03-12 ENCOUNTER — Other Ambulatory Visit: Payer: Self-pay | Admitting: Ophthalmology

## 2019-07-03 HISTORY — PX: ESOPHAGOGASTRODUODENOSCOPY: SHX1529

## 2019-07-08 ENCOUNTER — Other Ambulatory Visit: Payer: Self-pay | Admitting: Family Medicine

## 2019-07-08 DIAGNOSIS — Z1231 Encounter for screening mammogram for malignant neoplasm of breast: Secondary | ICD-10-CM

## 2019-07-16 ENCOUNTER — Emergency Department: Payer: No Typology Code available for payment source

## 2019-07-16 ENCOUNTER — Emergency Department
Admission: EM | Admit: 2019-07-16 | Discharge: 2019-07-16 | Disposition: A | Discharge status: Home / Self Care | Payer: No Typology Code available for payment source | Attending: Student | Admitting: Student

## 2019-07-16 ENCOUNTER — Other Ambulatory Visit: Payer: Self-pay

## 2019-07-16 DIAGNOSIS — K5733 Diverticulitis of large intestine without perforation or abscess with bleeding: Secondary | ICD-10-CM | POA: Diagnosis not present

## 2019-07-16 DIAGNOSIS — E039 Hypothyroidism, unspecified: Secondary | ICD-10-CM | POA: Diagnosis not present

## 2019-07-16 DIAGNOSIS — R109 Unspecified abdominal pain: Secondary | ICD-10-CM | POA: Diagnosis present

## 2019-07-16 DIAGNOSIS — Z79899 Other long term (current) drug therapy: Secondary | ICD-10-CM | POA: Insufficient documentation

## 2019-07-16 LAB — COMPREHENSIVE METABOLIC PANEL
ALT: 127 U/L — ABNORMAL HIGH (ref 0–44)
AST: 101 U/L — ABNORMAL HIGH (ref 15–41)
Albumin: 4.1 g/dL (ref 3.5–5.0)
Alkaline Phosphatase: 96 U/L (ref 38–126)
Anion gap: 9 (ref 5–15)
BUN: 13 mg/dL (ref 6–20)
CO2: 27 mmol/L (ref 22–32)
Calcium: 9 mg/dL (ref 8.9–10.3)
Chloride: 103 mmol/L (ref 98–111)
Creatinine, Ser: 0.69 mg/dL (ref 0.44–1.00)
GFR calc Af Amer: >60 mL/min (ref >60)
GFR calc non Af Amer: >60 mL/min (ref >60)
Glucose, Bld: 98 mg/dL (ref 70–99)
Potassium: 4.6 mmol/L (ref 3.5–5.1)
Sodium: 139 mmol/L (ref 135–145)
Total Bilirubin: 1.4 mg/dL — ABNORMAL HIGH (ref 0.3–1.2)
Total Protein: 7.2 g/dL (ref 6.5–8.1)

## 2019-07-16 LAB — CBC
HCT: 37 % (ref 36.0–46.0)
Hemoglobin: 12.8 g/dL (ref 12.0–15.0)
MCH: 30.2 pg (ref 26.0–34.0)
MCHC: 34.6 g/dL (ref 30.0–36.0)
MCV: 87.3 fL (ref 80.0–100.0)
Platelets: 263 10*3/uL (ref 150–400)
RBC: 4.24 MIL/uL (ref 3.87–5.11)
RDW: 12.7 % (ref 11.5–15.5)
WBC: 10.6 10*3/uL — ABNORMAL HIGH (ref 4.0–10.5)
nRBC: 0 % (ref 0.0–0.2)

## 2019-07-16 LAB — URINALYSIS, COMPLETE (UACMP) WITH MICROSCOPIC
Bilirubin Urine: NEGATIVE
Glucose, UA: NEGATIVE mg/dL
Ketones, ur: NEGATIVE mg/dL
Leukocytes,Ua: NEGATIVE
Nitrite: POSITIVE — AB
Protein, ur: NEGATIVE mg/dL
Specific Gravity, Urine: 1.02 (ref 1.005–1.030)
pH: 6 (ref 5.0–8.0)

## 2019-07-16 LAB — LIPASE, BLOOD: Lipase: 27 U/L (ref 11–51)

## 2019-07-16 MED ORDER — METRONIDAZOLE 500 MG PO TABS: 500.0000 mg | ORAL_TABLET | Freq: Three times a day (TID) | ORAL | 0 refills | Status: AC

## 2019-07-16 MED ORDER — IOHEXOL 300 MG/ML  SOLN
100.0000 mL | Freq: Once | INTRAMUSCULAR | Status: AC | PRN
  Administered 2019-07-16: 100 mL via INTRAVENOUS
  Filled 2019-07-16: qty 100

## 2019-07-16 MED ORDER — CIPROFLOXACIN HCL 500 MG PO TABS
500.0000 mg | ORAL_TABLET | Freq: Once | ORAL | Status: AC
  Administered 2019-07-16: 500 mg via ORAL
  Filled 2019-07-16: qty 1

## 2019-07-16 MED ORDER — METRONIDAZOLE 500 MG PO TABS
500.0000 mg | ORAL_TABLET | Freq: Once | ORAL | Status: AC
  Administered 2019-07-16: 500 mg via ORAL
  Filled 2019-07-16: qty 1

## 2019-07-16 MED ORDER — ONDANSETRON HCL 4 MG/2ML IJ SOLN
4.0000 mg | Freq: Once | INTRAMUSCULAR | Status: AC
  Administered 2019-07-16: 4 mg via INTRAVENOUS
  Filled 2019-07-16: qty 2

## 2019-07-16 MED ORDER — IOHEXOL 9 MG/ML PO SOLN
500.0000 mL | Freq: Once | ORAL | Status: AC
  Administered 2019-07-16: 500 mL via ORAL
  Filled 2019-07-16: qty 500

## 2019-07-16 MED ORDER — FENTANYL CITRATE (PF) 100 MCG/2ML IJ SOLN
50.0000 ug | Freq: Once | INTRAMUSCULAR | Status: AC
  Administered 2019-07-16: 50 ug via INTRAVENOUS
  Filled 2019-07-16: qty 2

## 2019-07-16 MED ORDER — CIPROFLOXACIN HCL 500 MG PO TABS: 500.0000 mg | ORAL_TABLET | Freq: Two times a day (BID) | ORAL | 0 refills | Status: AC

## 2019-07-16 NOTE — ED Provider Notes (Signed)
[facility] Emergency Department Provider Note   ____________________________________________   First MD Initiated Contact with Patient 07/16/19 [phone]     (approximate)  I have reviewed the triage vital signs and the nursing notes.   HISTORY  Chief Complaint Abdominal Pain    HPI Amber Booth is a 49 y.o. female patient complain of lower abdominal pain and distention for 2 days.  Patient denies vomiting or diarrhea.  States state nausea.  Patient has a history of diverticulitis diagnosed 4 years ago and treated.  No further problems until 2 days ago.  Patient rates the pain as 9/10.  Patient described the pain as "achy".  No palliative measure for complaint.         Past Medical History:  Diagnosis Date  . Diverticulitis 03/18/2016  . Thyroid disease     Patient Active Problem List   Diagnosis Date Noted  . Diverticulitis of colon 04/16/2016  . 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 ABDOMINAL HYSTERECTOMY    . ABDOMINAL SURGERY    . APPENDECTOMY    . CHOLECYSTECTOMY    . COLONOSCOPY  2016  . THYROIDECTOMY  10/2012    Prior to Admission medications   Medication Sig Start Date End Date Taking? Authorizing Provider  amphetamine-dextroamphetamine (ADDERALL XR) 20 MG 24 hr capsule Take 20 mg by mouth daily. 03/14/16   [provider]  calcium carbonate (OSCAL) 1500 (600 Ca) MG TABS tablet Take 600 mg of elemental calcium by mouth every other day. Take on the same days as Magnesium    [provider]  ciprofloxacin (CIPRO) 500 MG tablet Take 1 tablet (500 mg total) by mouth 2 (two) times daily for 10 days. 07/16/19 07/26/19  Joni Reining, PA-C  cyanocobalamin (,VITAMIN B-12,) 1000 MCG/ML injection Inject 1 mL into the muscle every 14 (fourteen) days. 02/21/16   [provider]  Lactobacillus (PROBIOTIC ACIDOPHILUS) CAPS Take 1 capsule by mouth daily.    [provider]  loratadine (CLARITIN) 10 MG tablet Take 10 mg by mouth daily.    [provider]  Magnesium 500 MG TABS Take 500 mg by mouth every other day. Take the same days as Calcium    [provider]  metroNIDAZOLE (FLAGYL) 500 MG tablet Take 1 tablet (500 mg total) by mouth 3 (three) times daily for 10 days. 07/16/19 07/26/19  Joni Reining, PA-C  omeprazole (PRILOSEC) 20 MG capsule Take 20 mg by mouth daily.    [provider]  SUMAtriptan (IMITREX) 100 MG tablet Take 100 mg by mouth as needed for migraine. May repeat in 2 hours if headache persists or recurs.    [provider]  thyroid (ARMOUR) 120 MG tablet Take 120 mg by mouth daily before breakfast.    [provider]    Allergies Codeine, Demerol [meperidine], Doxycycline, Nortriptyline, Oxycodone, and Percocet [oxycodone-acetaminophen]  Family History  Problem Relation Age of Onset  . Asthma Mother   . Hypertension Mother   . Lung cancer Father   . Colon cancer Father   . COPD Father   . Breast cancer Maternal Aunt 65       75  . Breast cancer Cousin   . Breast cancer Maternal Grandmother   . Breast cancer Maternal Aunt     Social History Social History   Tobacco Use  . Smoking status: Never Smoker  . Smokeless tobacco: Never Used  Substance Use  Topics  . Alcohol use: No  . Drug use: Not on file    Review of Systems  Constitutional: No fever/chills Eyes: No visual changes. ENT: No sore throat. Cardiovascular: Denies chest pain. Respiratory: Denies shortness of breath. Gastrointestinal: Bilateral lower abdominal pain.  Nausea without vomiting.  No diarrhea.  No constipation. Genitourinary: Negative for dysuria. Musculoskeletal: Negative for back pain. Skin: Negative for rash. Neurological: Negative for headaches, focal weakness or numbness. Endocrine:  Hypothyroidism. Allergic/Immunilogical: Codeine, Demerol, doxycycline, oxycodone, and  Percocets. ____________________________________________   PHYSICAL EXAM:  VITAL SIGNS: ED Triage Vitals  Enc Vitals Group     BP 07/16/19 0528 (!) 104/54     Pulse Rate 07/16/19 0528 81     Resp 07/16/19 0528 17     Temp 07/16/19 0528 97.8 F (36.6 C)     Temp Source 07/16/19 0528 Oral     SpO2 07/16/19 0528 100 %     Weight 07/16/19 0526 172 lb (78 kg)     Height 07/16/19 0526 5\' 8"  (1.727 m)     Head Circumference --      Peak Flow --      Pain Score 07/16/19 0526 9     Pain Loc --      Pain Edu? --      Excl. in [place]? --     Constitutional: Alert and oriented.  Moderate distress.   Cardiovascular: Normal rate, regular rhythm. Grossly normal heart sounds.  Good peripheral circulation. Respiratory: Normal respiratory effort.  No retractions. Lungs CTAB. Gastrointestinal: Soft and nontender.  Mild lower abdominal distention. No abdominal bruits. No CVA tenderness. Neurologic:  Normal speech and language. No gross focal neurologic deficits are appreciated. No gait instability. Skin:  Skin is warm, dry and intact. No rash noted. Psychiatric: Mood and affect are normal. Speech and behavior are normal.  ____________________________________________   LABS (all labs ordered are listed, but only abnormal results are displayed)  Labs Reviewed  COMPREHENSIVE METABOLIC PANEL - Abnormal; Notable for the following components:      Result Value   AST 101 (*)    ALT 127 (*)    Total Bilirubin 1.4 (*)    All other components within normal limits  CBC - Abnormal; Notable for the following components:   WBC 10.6 (*)    All other components within normal limits  URINALYSIS, COMPLETE (UACMP) WITH MICROSCOPIC - Abnormal; Notable for the following components:   Color, Urine AMBER (*)    APPearance HAZY (*)    Hgb urine dipstick SMALL (*)    Nitrite POSITIVE (*)    Bacteria, UA RARE (*)    All other components within normal limits  LIPASE, BLOOD    ____________________________________________  EKG   ____________________________________________  RADIOLOGY  ED MD interpretation:    Official radiology report(s): CT ABDOMEN PELVIS W CONTRAST  Result Date: 07/16/2019 CLINICAL DATA:  Abdominal pain and distension EXAM: CT ABDOMEN AND PELVIS WITH CONTRAST TECHNIQUE: Multidetector CT imaging of the abdomen and pelvis was performed using the standard protocol following bolus administration of intravenous contrast. Oral contrast was also administered. CONTRAST:  13mL OMNIPAQUE IOHEXOL 300 MG/ML  SOLN COMPARISON:  March 18, 2016 FINDINGS: Lower chest: Lung bases are clear. Hepatobiliary: No focal liver lesions are appreciable. Gallbladder is absent. There is mild intrahepatic biliary duct dilatation, a chronic finding likely secondary to post cholecystectomy state. There is less intrahepatic biliary duct dilatation compared to the previous study. There is no appreciable common hepatic or common bile duct  dilatation. No biliary duct mass or calculus evident. Pancreas: There is no pancreatic mass or inflammatory focus. Spleen: No splenic lesions are evident. Adrenals/Urinary Tract: Adrenals bilaterally appear unremarkable. There is no evident renal mass or hydronephrosis on either side. There is no evident renal or ureteral calculus on either side. Urinary bladder is midline with wall thickness within normal limits. Stomach/Bowel: There is wall thickening in the mid to distal sigmoid colon with an irregular diverticula in this area. There is adjacent mesenteric thickening. Inflammation is noted along the leftward aspect of the urinary bladder with mild impression on the leftward aspect of the urinary bladder. There is no abscess or perforation in this area of apparent localized diverticulitis. Elsewhere, there is no bowel wall or mesenteric thickening. The terminal ileum appears normal. There is mild lipomatous infiltration in the ileocecal valve. No  bowel obstruction is evident. There is no free air or portal venous air. Vascular/Lymphatic: There is no abdominal aortic aneurysm. No vascular lesions are evident. Major venous structures appear patent. A circumaortic renal vein is present, an anatomic variant. There is no evident adenopathy in the abdomen or pelvis. Reproductive: Uterus is absent.  There is no pelvic mass. Other: The appendix is absent. There is no periappendiceal region inflammation. No abscess is evident in the abdomen or pelvis. A small amount of ascites is present in the rightward aspect of the dependent portion of the pelvis. Musculoskeletal: No blastic or lytic bone lesions. No intramuscular or abdominal wall lesions are appreciable. IMPRESSION: 1. Evidence of mid to distal sigmoid diverticulitis without perforation or abscess. Inflammation from the diverticulitis impresses upon the leftward aspect of the urinary bladder. No overall urinary bladder wall thickening. 2. No abscess elsewhere in the abdomen or pelvis. No bowel obstruction. Appendix absent. 3. Slight ascites in the dependent portion of the pelvis may be secondary to reactive change from the diverticulitis. 4.  No renal or ureteral calculus.  No hydronephrosis. 5. Gallbladder absent. Slight intrahepatic biliary duct dilatation is less notable than on previous study and is likely secondary to chronic post cholecystectomy state. Electronically Signed   By: Bretta Bang III M.D.   On: 07/16/2019 10:34    ____________________________________________   PROCEDURES  Procedure(s) performed (including Critical Care):  Procedures   ____________________________________________   INITIAL IMPRESSION / ASSESSMENT AND PLAN / ED COURSE  As part of my medical decision making, I reviewed the following data within the electronic MEDICAL RECORD NUMBER     Patient presents with lower abdominal pain distention for 2 days.  Patient is afebrile.  Discussed labs and CT results with  patient.  Patient complaint physical exam consistent with diverticulitis.  Patient given discharge care instruction and prescription for Flagyl and Cipro.  Patient advised to follow-up with her treating gastroenterologist for reevaluation.    Amber Booth was evaluated in Emergency Department on 07/16/2019 for the symptoms described in the history of present illness. She was evaluated in the context of the global COVID-19 pandemic, which necessitated consideration that the patient might be at risk for infection with the SARS-CoV-2 virus that causes COVID-19. Institutional protocols and algorithms that pertain to the evaluation of patients at risk for COVID-19 are in a state of rapid change based on information released by regulatory bodies including the CDC and federal and state organizations. These policies and algorithms were followed during the patient's care in the ED.       ____________________________________________   FINAL CLINICAL IMPRESSION(S) / ED DIAGNOSES  Final diagnoses:  Diverticulitis  large intestine w/o perforation or abscess w/bleeding     ED Discharge Orders         Ordered    metroNIDAZOLE (FLAGYL) 500 MG tablet  3 times daily     07/16/19 1050    ciprofloxacin (CIPRO) 500 MG tablet  2 times daily     07/16/19 1050           Note:  This document was prepared using Dragon voice recognition software and may include unintentional dictation errors.    Joni Reining, PA-C 07/16/19 1100    Miguel Aschoff., MD 07/16/19 [phone]

## 2019-07-16 NOTE — ED Triage Notes (Signed)
Patient c/o lower abdominal pain and distension X 2 days. Patient reports hx of diverticulitis. Patient was exposed to COVID patient at work.

## 2019-07-16 NOTE — Discharge Instructions (Addendum)
Take medication as directed and follow-up with gastroenterology

## 2019-07-16 NOTE — ED Notes (Signed)
ED Provider at bedside. 

## 2019-07-21 ENCOUNTER — Ambulatory Visit: Payer: 59 | Attending: Internal Medicine

## 2019-07-21 DIAGNOSIS — Z20822 Contact with and (suspected) exposure to covid-19: Secondary | ICD-10-CM

## 2019-07-22 LAB — NOVEL CORONAVIRUS, NAA: SARS-CoV-2, NAA: NOT DETECTED

## 2019-08-21 ENCOUNTER — Ambulatory Visit
Admission: RE | Admit: 2019-08-21 | Discharge: 2019-08-21 | Disposition: A | Discharge status: Home / Self Care | Payer: 59 | Source: Ambulatory Visit | Attending: Family Medicine | Admitting: Family Medicine

## 2019-08-21 DIAGNOSIS — Z1231 Encounter for screening mammogram for malignant neoplasm of breast: Secondary | ICD-10-CM

## 2020-01-25 ENCOUNTER — Ambulatory Visit (INDEPENDENT_AMBULATORY_CARE_PROVIDER_SITE_OTHER): Payer: 59 | Admitting: Urology

## 2020-01-25 ENCOUNTER — Other Ambulatory Visit: Payer: Self-pay

## 2020-01-25 VITALS — BP 112/74 | HR 80 | Ht 68.0 in | Wt 169.0 lb

## 2020-01-25 DIAGNOSIS — R300 Dysuria: Secondary | ICD-10-CM | POA: Diagnosis not present

## 2020-01-25 DIAGNOSIS — N3020 Other chronic cystitis without hematuria: Secondary | ICD-10-CM | POA: Diagnosis not present

## 2020-01-25 LAB — MICROSCOPIC EXAMINATION

## 2020-01-25 LAB — URINALYSIS, COMPLETE
Bilirubin, UA: NEGATIVE
Glucose, UA: NEGATIVE
Ketones, UA: NEGATIVE
Leukocytes,UA: NEGATIVE
Nitrite, UA: NEGATIVE
Protein,UA: NEGATIVE
Specific Gravity, UA: 1.02 (ref 1.005–1.030)
Urobilinogen, Ur: 0.2 mg/dL (ref 0.2–1.0)
pH, UA: 6.5 (ref 5.0–7.5)

## 2020-01-25 NOTE — Progress Notes (Signed)
01/25/2020 8:35 AM   Amber Booth [birth year] 27, 1972 [id number]  Referring provider: Jerl Mina, MD [address] [facility] [place],  [place] [address]  Chief Complaint  Patient presents with  . Dysuria    HPI: I was consulted to assist the patient's lower abdominal pain.  Once or twice a month she gets significant suprapubic pain worse with a full bladder partially relieved by voiding.  It will last 3 or 4 days.  Azo-Standard helps minimally.  Sometimes she has painful intercourse.  She can void every 2 hours gets up 2-3 times Booth night.  Her flow is variable.  Sometimes she does not feel empty.  She has had a hysterectomy  She has had previous kidney stones but no previous surgery.  She normally does not get bladder infections.  She describes negative urines during flareups in the past.  No neurologic issues but bowel movements normal   PMH: Past Medical History:  Diagnosis Date  . Diverticulitis 03/18/2016  . Thyroid disease     Surgical History: Past Surgical History:  Procedure Laterality Date  . ABDOMINAL HYSTERECTOMY    . ABDOMINAL SURGERY    . APPENDECTOMY    . CHOLECYSTECTOMY    . COLONOSCOPY  2016  . THYROIDECTOMY  10/2012    Home Medications:  Allergies as of 01/25/2020      Reactions   Codeine Nausea And Vomiting   Demerol [meperidine] Nausea And Vomiting, Other (See Comments)   Also causes blood pressure to drop too low.   Doxycycline    Per pt, Dr. Marva Panda states "liver function increases" and to avoid   Nortriptyline Other (See Comments)   NIGHTMARES   Oxycodone Nausea And Vomiting   Percocet [oxycodone-acetaminophen] Rash      Medication List       Accurate as of January 25, 2020  8:35 AM. If you have any questions, ask your nurse or doctor.        amphetamine-dextroamphetamine 20 MG 24 hr capsule Commonly known as: ADDERALL XR Take 20 mg by mouth daily.   calcium carbonate 1500 (600 Ca) MG Tabs tablet Commonly known as:  OSCAL Take 600 mg of elemental calcium by mouth every other day. Take on the same days as Magnesium   cyanocobalamin 1000 MCG/ML injection Commonly known as: (VITAMIN B-12) Inject 1 mL into the muscle every 14 (fourteen) days.   loratadine 10 MG tablet Commonly known as: CLARITIN Take 10 mg by mouth daily.   Magnesium 500 MG Tabs Take 500 mg by mouth every other day. Take the same days as Calcium   omeprazole 20 MG capsule Commonly known as: PRILOSEC Take 20 mg by mouth daily.   Probiotic Acidophilus Caps Take 1 capsule by mouth daily.   SUMAtriptan 100 MG tablet Commonly known as: IMITREX Take 100 mg by mouth as needed for migraine. May repeat in 2 hours if headache persists or recurs.   thyroid 120 MG tablet Commonly known as: ARMOUR Take 120 mg by mouth daily before breakfast.       Allergies:  Allergies  Allergen Reactions  . Codeine Nausea And Vomiting  . Demerol [Meperidine] Nausea And Vomiting and Other (See Comments)    Also causes blood pressure to drop too low.  . Doxycycline     Per pt, Dr. Marva Panda states "liver function increases" and to avoid  . Nortriptyline Other (See Comments)    NIGHTMARES  . Oxycodone Nausea And Vomiting  . Percocet [Oxycodone-Acetaminophen] Rash  Family History: Family History  Problem Relation Age of Onset  . Asthma Mother   . Hypertension Mother   . Lung cancer Father   . Colon cancer Father   . COPD Father   . Breast cancer Maternal Aunt 65       75  . Breast cancer Cousin   . Breast cancer Maternal Grandmother   . Breast cancer Maternal Aunt     Social History:  reports that she has never smoked. She has never used smokeless tobacco. She reports that she does not drink alcohol. No history on file for drug use.  ROS:                                        Physical Exam: There were no vitals taken for this visit.  Constitutional:  Alert and oriented, No acute distress. HEENT: Amber Booth,  moist mucus membranes.  Trachea midline, no masses. Cardiovascular: No clubbing, cyanosis, or edema. Respiratory: Normal respiratory effort, no increased work of breathing. GI: Abdomen is soft, nontender, nondistended, no abdominal masses GU: No CVA tenderness.  No bladder or levator tenderness.  No prolapse or stress incontinence Skin: No rashes, bruises or suspicious lesions. Lymph: No cervical or inguinal adenopathy. Neurologic: Grossly intact, no focal deficits, moving all 4 extremities. Psychiatric: Normal mood and affect.  Laboratory Data: Lab Results  Component Value Date   WBC 10.6 (H) 07/16/2019   HGB 12.8 07/16/2019   HCT 37.0 07/16/2019   MCV 87.3 07/16/2019   PLT 263 07/16/2019    Lab Results  Component Value Date   CREATININE 0.69 07/16/2019    No results found for: PSA  No results found for: TESTOSTERONE  No results found for: HGBA1C  Urinalysis    Component Value Date/Time   COLORURINE AMBER (A) 07/16/2019 0531   APPEARANCEUR HAZY (A) 07/16/2019 0531   APPEARANCEUR Clear 12/11/2012 1513   LABSPEC 1.020 07/16/2019 0531   LABSPEC 1.009 12/11/2012 1513   PHURINE 6.0 07/16/2019 0531   GLUCOSEU NEGATIVE 07/16/2019 0531   GLUCOSEU Negative 12/11/2012 1513   HGBUR SMALL (A) 07/16/2019 0531   BILIRUBINUR NEGATIVE 07/16/2019 0531   BILIRUBINUR Negative 12/11/2012 1513   KETONESUR NEGATIVE 07/16/2019 0531   PROTEINUR NEGATIVE 07/16/2019 0531   NITRITE POSITIVE (A) 07/16/2019 0531   LEUKOCYTESUR NEGATIVE 07/16/2019 0531   LEUKOCYTESUR Negative 12/11/2012 1513    Pertinent Imaging: Urine reviewed.  Urine sent for culture.  Chart reviewed.   Assessment & Plan: Patient may have interstitial cystitis.  Hydrodistention discussed.  Usual template discussed.  Patient has reactive hepatitis and diverticulitis but she says this pain is different and much lower in the pelvis.  The pain with intercourse is when her bladder is more full  She has a lot of allergies and  is obviously taken a lot of pain medicine over the years 1. Dysuria    No follow-ups on file.  Amber Sinner, MD  [facility] [address], [address] [place], [place] [address] [phone]

## 2020-01-27 LAB — CULTURE, URINE COMPREHENSIVE

## 2020-01-28 ENCOUNTER — Other Ambulatory Visit: Payer: Self-pay | Admitting: Urology

## 2020-02-10 ENCOUNTER — Other Ambulatory Visit: Payer: Self-pay | Admitting: Urology

## 2020-02-10 DIAGNOSIS — N3020 Other chronic cystitis without hematuria: Secondary | ICD-10-CM

## 2020-02-12 ENCOUNTER — Other Ambulatory Visit: Payer: Self-pay

## 2020-02-12 ENCOUNTER — Encounter
Admission: RE | Admit: 2020-02-12 | Discharge: 2020-02-12 | Disposition: A | Discharge status: Home / Self Care | Payer: 59 | Source: Ambulatory Visit | Attending: Urology | Admitting: Urology

## 2020-02-12 HISTORY — DX: Personal history of urinary calculi: Z87.442

## 2020-02-12 HISTORY — DX: Endometriosis, unspecified: N80.9

## 2020-02-12 HISTORY — DX: Headache, unspecified: R51.9

## 2020-02-12 HISTORY — DX: Anxiety disorder, unspecified: F41.9

## 2020-02-12 HISTORY — DX: Nausea with vomiting, unspecified: R11.2

## 2020-02-12 HISTORY — DX: Duodenal ulcer, unspecified as acute or chronic, without hemorrhage or perforation: K26.9

## 2020-02-12 HISTORY — DX: Other specified postprocedural states: Z98.890

## 2020-02-12 HISTORY — DX: Other specified behavioral and emotional disorders with onset usually occurring in childhood and adolescence: F98.8

## 2020-02-12 HISTORY — DX: Other specified congenital malformations of intestine: Q43.8

## 2020-02-12 HISTORY — DX: Liver disease, unspecified: K76.9

## 2020-02-12 HISTORY — DX: Unspecified asthma, uncomplicated: J45.909

## 2020-02-12 HISTORY — DX: Hypothyroidism, unspecified: E03.9

## 2020-02-12 HISTORY — DX: Genetic carrier of other disease: Z14.8

## 2020-02-12 NOTE — Patient Instructions (Signed)
COVID TESTING Date: February 18, 2020 THURSDAY Testing site:  [facility] [address] Thru Hours:  7:90 am - 1:00 pm Once you are tested, you are asked to stay quarantined (avoiding public places) until after your surgery.    Your procedure is scheduled on: February 22, 2020 Monday  Report to Day Surgery on the 2nd floor of the Medical [facility]. To find out your arrival time, please call [phone] between 1PM - 3PM on: Friday February 19, 2020  REMEMBER: Instructions that are not followed completely may result in serious medical risk, up to and including death; or upon the discretion of your surgeon and anesthesiologist your surgery may need to be rescheduled.  Do not eat food after midnight the night before surgery.  No gum chewing, lozengers or hard candies.  You may however, drink CLEAR liquids up to 2 hours before you are scheduled to arrive for your surgery. Do not drink anything within 2 hours of your scheduled arrival time.  Clear liquids include: - water  - apple juice without pulp - gatorade (not RED) - black coffee or tea (Do NOT add milk or creamers to the coffee or tea) Do NOT drink anything that is not on this list.  Type 1 and Type 2 diabetics should only drink water.  TAKE THESE MEDICATIONS THE MORNING OF SURGERY WITH A SIP OF WATER: Thyroid armour Omeprazole ( take a dose the night before surgery and the morning of surgery)   Follow recommendations from Cardiologist, Pulmonologist or PCP regarding stopping Aspirin, Coumadin, Plavix, Eliquis, Pradaxa, or Pletal.  Stop Anti-inflammatories (NSAIDS) such as Advil, Aleve, Ibuprofen, Motrin, Naproxen, Naprosyn and Aspirin based products such as Excedrin, Goodys Powder, BC Powder. (May take Tylenol or Acetaminophen if needed.)  Stop ANY OVER THE COUNTER supplements until after surgery. (May continue Vitamin D, Vitamin B, and multivitamin.)  No Alcohol for 24 hours before or after  surgery.  No Smoking including e-cigarettes for 24 hours prior to surgery.  No chewable tobacco products for at least 6 hours prior to surgery.  No nicotine patches on the day of surgery.  Do not use any "recreational" drugs for at least a week prior to your surgery.  Please be advised that the combination of cocaine and anesthesia may have negative outcomes, up to and including death. If you test positive for cocaine, your surgery will be cancelled.  On the morning of surgery brush your teeth with toothpaste and water, you may rinse your mouth with mouthwash if you wish. Do not swallow any toothpaste or mouthwash.  Do not wear jewelry, make-up, hairpins, clips or nail polish.  Do not wear lotions, powders, or perfumes.   Do not shave 48 hours prior to surgery.   Contact lenses, hearing aids and dentures may not be worn into surgery.  Do not bring valuables to the hospital. [facility] is not responsible for any missing/lost belongings or valuables.   SHOWER THE DAY OF SURGERY  Notify your doctor if there is any change in your medical condition (cold, fever, infection).  Wear comfortable clothing (specific to your surgery type) to the hospital.  Plan for stool softeners for home use; pain medications have a tendency to cause constipation. You can also help prevent constipation by eating foods high in fiber such as fruits and vegetables and drinking plenty of fluids as your diet allows.  After surgery, you can help prevent lung complications by doing breathing exercises.  Take deep breaths and cough  every 1-2 hours. Your doctor may order a device called an Incentive Spirometer to help you take deep breaths. When coughing or sneezing, hold a pillow firmly against your incision with both hands. This is called "splinting." Doing this helps protect your incision. It also decreases belly discomfort.  If you are being discharged the day of surgery, you will not be allowed to drive  home. You will need a responsible adult (18 years or older) to drive you home and stay with you that night.   Please call the Pre-admissions Testing Dept. at [phone] if you have any questions about these instructions.  Visitation Policy:  Patients undergoing a surgery or procedure may have one family member or support person with them as long as that person is not COVID-19 positive or experiencing its symptoms.  That person may remain in the waiting area during the procedure.  Children under 48 years of age may have both parents or legal guardians with them during their procedure.  Inpatient Visitation Update:   In an effort to ensure the safety of our team members and our patients, we are implementing a change to our visitation policy:  Effective Monday, Aug. 9, at 7 a.m., inpatients will be allowed one support person.  o The support person may change daily.  o The support person must pass our screening, gel in and out, and wear a mask at all times, including in the patient's room.  o Patients must also wear a mask when staff or their support person are in the room.  o Masking is required regardless of vaccination status.  Systemwide, no visitors 48 or younger.

## 2020-02-15 ENCOUNTER — Encounter: Payer: Self-pay | Admitting: Urgent Care

## 2020-02-18 ENCOUNTER — Other Ambulatory Visit: Payer: Self-pay

## 2020-02-18 ENCOUNTER — Other Ambulatory Visit
Admission: RE | Admit: 2020-02-18 | Discharge: 2020-02-18 | Disposition: A | Discharge status: Home / Self Care | Payer: 59 | Source: Ambulatory Visit | Attending: Urology | Admitting: Urology

## 2020-02-18 DIAGNOSIS — Z01812 Encounter for preprocedural laboratory examination: Secondary | ICD-10-CM | POA: Insufficient documentation

## 2020-02-18 DIAGNOSIS — Z20822 Contact with and (suspected) exposure to covid-19: Secondary | ICD-10-CM | POA: Diagnosis not present

## 2020-02-19 LAB — SARS CORONAVIRUS 2 (TAT 6-24 HRS): SARS Coronavirus 2: NEGATIVE

## 2020-02-22 ENCOUNTER — Ambulatory Visit: Payer: 59 | Admitting: Anesthesiology

## 2020-02-22 ENCOUNTER — Ambulatory Visit
Admission: RE | Admit: 2020-02-22 | Discharge: 2020-02-22 | Disposition: A | Discharge status: Home / Self Care | Payer: 59 | Attending: Urology | Admitting: Urology

## 2020-02-22 ENCOUNTER — Encounter
Admission: RE | Disposition: A | Discharge status: Home / Self Care | Payer: Self-pay | Source: Home / Self Care | Attending: Urology

## 2020-02-22 ENCOUNTER — Other Ambulatory Visit: Payer: Self-pay

## 2020-02-22 ENCOUNTER — Encounter: Payer: Self-pay | Admitting: Urology

## 2020-02-22 DIAGNOSIS — N3010 Interstitial cystitis (chronic) without hematuria: Secondary | ICD-10-CM | POA: Diagnosis not present

## 2020-02-22 DIAGNOSIS — Z79899 Other long term (current) drug therapy: Secondary | ICD-10-CM | POA: Insufficient documentation

## 2020-02-22 DIAGNOSIS — Z881 Allergy status to other antibiotic agents status: Secondary | ICD-10-CM | POA: Diagnosis not present

## 2020-02-22 DIAGNOSIS — Z79890 Hormone replacement therapy (postmenopausal): Secondary | ICD-10-CM | POA: Diagnosis not present

## 2020-02-22 DIAGNOSIS — E890 Postprocedural hypothyroidism: Secondary | ICD-10-CM | POA: Diagnosis not present

## 2020-02-22 DIAGNOSIS — Z87442 Personal history of urinary calculi: Secondary | ICD-10-CM | POA: Diagnosis not present

## 2020-02-22 DIAGNOSIS — N3020 Other chronic cystitis without hematuria: Secondary | ICD-10-CM

## 2020-02-22 DIAGNOSIS — Z885 Allergy status to narcotic agent status: Secondary | ICD-10-CM | POA: Diagnosis not present

## 2020-02-22 HISTORY — PX: CYSTO WITH HYDRODISTENSION: SHX5453

## 2020-02-22 SURGERY — CYSTOSCOPY, WITH BLADDER HYDRODISTENSION
Anesthesia: General

## 2020-02-22 MED ORDER — BELLADONNA ALKALOIDS-OPIUM 16.2-60 MG RE SUPP
RECTAL | Status: AC
  Filled 2020-02-22: qty 1

## 2020-02-22 MED ORDER — CIPROFLOXACIN IN D5W 400 MG/200ML IV SOLN
400.0000 mg | INTRAVENOUS | Status: AC
  Administered 2020-02-22: 400 mg via INTRAVENOUS

## 2020-02-22 MED ORDER — TRAMADOL HCL 50 MG PO TABS: 50.0000 mg | ORAL_TABLET | Freq: Four times a day (QID) | ORAL | 0 refills | Status: DC | PRN

## 2020-02-22 MED ORDER — SCOPOLAMINE 1 MG/3DAYS TD PT72
MEDICATED_PATCH | TRANSDERMAL | Status: AC
  Administered 2020-02-22: 1.5 mg via TRANSDERMAL
  Filled 2020-02-22: qty 1

## 2020-02-22 MED ORDER — DEXAMETHASONE SODIUM PHOSPHATE 10 MG/ML IJ SOLN
INTRAMUSCULAR | Status: DC | PRN
  Administered 2020-02-22: 10 mg via INTRAVENOUS

## 2020-02-22 MED ORDER — FENTANYL CITRATE (PF) 100 MCG/2ML IJ SOLN
INTRAMUSCULAR | Status: DC | PRN
  Administered 2020-02-22: 25 ug via INTRAVENOUS

## 2020-02-22 MED ORDER — LIDOCAINE HCL (CARDIAC) PF 100 MG/5ML IV SOSY
PREFILLED_SYRINGE | INTRAVENOUS | Status: DC | PRN
  Administered 2020-02-22: 100 mg via INTRAVENOUS

## 2020-02-22 MED ORDER — FENTANYL CITRATE (PF) 100 MCG/2ML IJ SOLN: 25.0000 ug | INTRAMUSCULAR | Status: DC | PRN

## 2020-02-22 MED ORDER — CIPROFLOXACIN IN D5W 400 MG/200ML IV SOLN
INTRAVENOUS | Status: AC
  Filled 2020-02-22: qty 200

## 2020-02-22 MED ORDER — CHLORHEXIDINE GLUCONATE 0.12 % MT SOLN: 15.0000 mL | Freq: Once | OROMUCOSAL | Status: AC

## 2020-02-22 MED ORDER — FENTANYL CITRATE (PF) 100 MCG/2ML IJ SOLN
INTRAMUSCULAR | Status: AC
  Filled 2020-02-22: qty 2

## 2020-02-22 MED ORDER — LIDOCAINE HCL URETHRAL/MUCOSAL 2 % EX GEL
CUTANEOUS | Status: AC
  Filled 2020-02-22: qty 10

## 2020-02-22 MED ORDER — MIDAZOLAM HCL 2 MG/2ML IJ SOLN
INTRAMUSCULAR | Status: AC
  Filled 2020-02-22: qty 2

## 2020-02-22 MED ORDER — BUPIVACAINE HCL (PF) 0.5 % IJ SOLN
INTRAMUSCULAR | Status: AC
  Filled 2020-02-22: qty 30

## 2020-02-22 MED ORDER — ORAL CARE MOUTH RINSE: 15.0000 mL | Freq: Once | OROMUCOSAL | Status: AC

## 2020-02-22 MED ORDER — SCOPOLAMINE 1 MG/3DAYS TD PT72: 1.0000 | MEDICATED_PATCH | TRANSDERMAL | Status: DC

## 2020-02-22 MED ORDER — PROPOFOL 10 MG/ML IV BOLUS
INTRAVENOUS | Status: AC
  Filled 2020-02-22: qty 20

## 2020-02-22 MED ORDER — STERILE WATER FOR IRRIGATION IR SOLN
Status: DC | PRN
  Administered 2020-02-22: 1400 mL via INTRAVESICAL

## 2020-02-22 MED ORDER — CHLORHEXIDINE GLUCONATE 0.12 % MT SOLN
OROMUCOSAL | Status: AC
  Administered 2020-02-22: 15 mL via OROMUCOSAL
  Filled 2020-02-22: qty 15

## 2020-02-22 MED ORDER — PROPOFOL 10 MG/ML IV BOLUS
INTRAVENOUS | Status: DC | PRN
  Administered 2020-02-22: 160 mg via INTRAVENOUS
  Administered 2020-02-22: 30 mg via INTRAVENOUS
  Administered 2020-02-22: 20 mg via INTRAVENOUS

## 2020-02-22 MED ORDER — BUPIVACAINE-EPINEPHRINE (PF) 0.5% -1:200000 IJ SOLN
INTRAMUSCULAR | Status: DC | PRN
  Administered 2020-02-22: 10 mL

## 2020-02-22 MED ORDER — ONDANSETRON HCL 4 MG/2ML IJ SOLN
INTRAMUSCULAR | Status: DC | PRN
  Administered 2020-02-22: 4 mg via INTRAVENOUS

## 2020-02-22 MED ORDER — PROMETHAZINE HCL 25 MG/ML IJ SOLN: 6.2500 mg | INTRAMUSCULAR | Status: DC | PRN

## 2020-02-22 MED ORDER — LACTATED RINGERS IV SOLN: INTRAVENOUS | Status: DC

## 2020-02-22 MED ORDER — MIDAZOLAM HCL 2 MG/2ML IJ SOLN
INTRAMUSCULAR | Status: DC | PRN
  Administered 2020-02-22: 2 mg via INTRAVENOUS

## 2020-02-22 SURGICAL SUPPLY — 13 items
BAG DRAIN CYSTO-URO LG1000N (MISCELLANEOUS) ×3 IMPLANT
CATH ROBINSON RED A/P 16FR (CATHETERS) ×3 IMPLANT
DRAPE UNDER BUTTOCK W/FLU (DRAPES) ×2 IMPLANT
GLOVE BIOGEL M STRL SZ7.5 (GLOVE) ×3 IMPLANT
GOWN STRL REUS W/TWL XL LVL3 (GOWN DISPOSABLE) ×3 IMPLANT
NDL HYPO 25X1 1.5 SAFETY (NEEDLE) IMPLANT
NEEDLE HYPO 25X1 1.5 SAFETY (NEEDLE) IMPLANT
PACK CYSTO AR (MISCELLANEOUS) ×3 IMPLANT
SET CYSTO W/LG BORE CLAMP LF (SET/KITS/TRAYS/PACK) ×3 IMPLANT
SURGILUBE 2OZ TUBE FLIPTOP (MISCELLANEOUS) ×3 IMPLANT
SYR 10ML LL (SYRINGE) ×3 IMPLANT
TUBING CONNECTING 10 (TUBING) ×2 IMPLANT
TUBING CONNECTING 10' (TUBING) ×1

## 2020-02-22 NOTE — Anesthesia Procedure Notes (Signed)
Procedure Name: LMA Insertion Date/Time: 02/22/2020 12:30 PM Performed by: Henrietta Hoover, CRNA Pre-anesthesia Checklist: Patient identified, Patient being monitored, Timeout performed, Emergency Drugs available and Suction available Patient Re-evaluated:Patient Re-evaluated prior to induction Oxygen Delivery Method: Circle system utilized Preoxygenation: Pre-oxygenation with 100% oxygen Induction Type: IV induction Ventilation: Mask ventilation without difficulty LMA: LMA inserted LMA Size: 4.0 Tube type: Oral Number of attempts: 1 Placement Confirmation: positive ETCO2 and breath sounds checked- equal and bilateral Tube secured with: Tape Dental Injury: Teeth and Oropharynx as per pre-operative assessment

## 2020-02-22 NOTE — Transfer of Care (Signed)
Immediate Anesthesia Transfer of Care Note  Patient: Amber Booth  Procedure(s) Performed: CYSTOSCOPY/HYDRODISTENSION WITH MARCAINE (N/A )  Patient Location: PACU  Anesthesia Type:General  Level of Consciousness: awake, alert  and oriented  Airway & Oxygen Therapy: Patient Spontanous Breathing and Patient connected to face mask oxygen  Post-op Assessment: Report given to RN and Post -op Vital signs reviewed and stable  Post vital signs: Reviewed and stable  Last Vitals:  Vitals Value Taken Time  BP 115/73 02/22/20 1311  Temp 36.2 C 02/22/20 1311  Pulse 76 02/22/20 1311  Resp 9 02/22/20 1311  SpO2 100 % 02/22/20 1311  Vitals shown include unvalidated device data.  Last Pain:  Vitals:   02/22/20 1131  TempSrc: Tympanic  PainSc: 0-No pain         Complications: No complications documented.

## 2020-02-22 NOTE — Interval H&P Note (Signed)
History and Physical Interval Note:  02/22/2020 11:49 AM  Amber Booth  has presented today for surgery, with the diagnosis of Chronic Cystitis.  The various methods of treatment have been discussed with the patient and family. After consideration of risks, benefits and other options for treatment, the patient has consented to  Procedure(s): CYSTOSCOPY/HYDRODISTENSION WITH MARCAINE (N/A) as a surgical intervention.  The patient's history has been reviewed, patient examined, no change in status, stable for surgery.  I have reviewed the patient's chart and labs.  Questions were answered to the patient's satisfaction.     Earvin Blazier A Parry Po

## 2020-02-22 NOTE — Discharge Instructions (Signed)
AMBULATORY SURGERY  DISCHARGE INSTRUCTIONS   1) The drugs that you were given will stay in your system until tomorrow so for the next 24 hours you should not:  A) Drive an automobile B) Make any legal decisions C) Drink any alcoholic beverage   2) You may resume regular meals tomorrow.  Today it is better to start with liquids and gradually work up to solid foods.  You may eat anything you prefer, but it is better to start with liquids, then soup and crackers, and gradually work up to solid foods.   3) Please notify your doctor immediately if you have any unusual bleeding, trouble breathing, redness and pain at the surgery site, drainage, fever, or pain not relieved by medication.    4) Additional Instructions:        Please contact your physician with any problems or Same Day Surgery at [phone], Monday through Friday 6 am to 4 pm, or [place] at [facility] number at [phone].I have reviewed discharge instructions in detail with the patient. They will follow-up with me or their physician as scheduled. My nurse will also be calling the patients as per protocol.   Resume normal activity tomorrow Pain meds sent We will call her in am   AMBULATORY SURGERY  DISCHARGE INSTRUCTIONS   5) The drugs that you were given will stay in your system until tomorrow so for the next 24 hours you should not:  D) Drive an automobile E) Make any legal decisions F) Drink any alcoholic beverage   6) You may resume regular meals tomorrow.  Today it is better to start with liquids and gradually work up to solid foods.  You may eat anything you prefer, but it is better to start with liquids, then soup and crackers, and gradually work up to solid foods.   7) Please notify your doctor immediately if you have any unusual bleeding, trouble breathing, redness and pain at the surgery site, drainage, fever, or pain not relieved by medication.    8) Additional  Instructions:        Please contact your physician with any problems or Same Day Surgery at [phone], Monday through Friday 6 am to 4 pm, or [place] at [facility] number at [phone].

## 2020-02-22 NOTE — H&P (Signed)
HPI: I was consulted to assist the patient's lower abdominal pain.  Once or twice a month she gets significant suprapubic pain worse with a full bladder partially relieved by voiding.  It will last 3 or 4 days.  Azo-Standard helps minimally.  Sometimes she has painful intercourse.  She can void every 2 hours gets up 2-3 times at night.  Her flow is variable.  Sometimes she does not feel empty.  She has had a hysterectomy  She has had previous kidney stones but no previous surgery.  She normally does not get bladder infections.  She describes negative urines during flareups in the past.  No neurologic issues but bowel movements normal   PMH:     Past Medical History:  Diagnosis Date  . Diverticulitis 03/18/2016  . Thyroid disease     Surgical History:      Past Surgical History:  Procedure Laterality Date  . ABDOMINAL HYSTERECTOMY    . ABDOMINAL SURGERY    . APPENDECTOMY    . CHOLECYSTECTOMY    . COLONOSCOPY  2016  . THYROIDECTOMY  10/2012    Home Medications:       Allergies as of 01/25/2020      Reactions   Codeine Nausea And Vomiting   Demerol [meperidine] Nausea And Vomiting, Other (See Comments)   Also causes blood pressure to drop too low.   Doxycycline    Per pt, Dr. Marva Panda states "liver function increases" and to avoid   Nortriptyline Other (See Comments)   NIGHTMARES   Oxycodone Nausea And Vomiting   Percocet [oxycodone-acetaminophen] Rash         Medication List       Accurate as of January 25, 2020  8:35 AM. If you have any questions, ask your nurse or doctor.        amphetamine-dextroamphetamine 20 MG 24 hr capsule Commonly known as: ADDERALL XR Take 20 mg by mouth daily.   calcium carbonate 1500 (600 Ca) MG Tabs tablet Commonly known as: OSCAL Take 600 mg of elemental calcium by mouth every other day. Take on the same days as Magnesium   cyanocobalamin 1000 MCG/ML injection Commonly known as: (VITAMIN  B-12) Inject 1 mL into the muscle every 14 (fourteen) days.   loratadine 10 MG tablet Commonly known as: CLARITIN Take 10 mg by mouth daily.   Magnesium 500 MG Tabs Take 500 mg by mouth every other day. Take the same days as Calcium   omeprazole 20 MG capsule Commonly known as: PRILOSEC Take 20 mg by mouth daily.   Probiotic Acidophilus Caps Take 1 capsule by mouth daily.   SUMAtriptan 100 MG tablet Commonly known as: IMITREX Take 100 mg by mouth as needed for migraine. May repeat in 2 hours if headache persists or recurs.   thyroid 120 MG tablet Commonly known as: ARMOUR Take 120 mg by mouth daily before breakfast.       Allergies:       Allergies  Allergen Reactions  . Codeine Nausea And Vomiting  . Demerol [Meperidine] Nausea And Vomiting and Other (See Comments)    Also causes blood pressure to drop too low.  . Doxycycline     Per pt, Dr. Marva Panda states "liver function increases" and to avoid  . Nortriptyline Other (See Comments)    NIGHTMARES  . Oxycodone Nausea And Vomiting  . Percocet [Oxycodone-Acetaminophen] Rash    Family History:      Family History  Problem Relation Age of Onset  . Asthma  Mother   . Hypertension Mother   . Lung cancer Father   . Colon cancer Father   . COPD Father   . Breast cancer Maternal Aunt 65       75  . Breast cancer Cousin   . Breast cancer Maternal Grandmother   . Breast cancer Maternal Aunt     Social History:  reports that she has never smoked. She has never used smokeless tobacco. She reports that she does not drink alcohol. No history on file for drug use.  ROS:              Physical Exam: There were no vitals taken for this visit.  Constitutional:  Alert and oriented, No acute distress. HEENT: [place] AT, moist mucus membranes.  Trachea midline, no masses. Cardiovascular: No clubbing, cyanosis, or edema. Respiratory: Normal respiratory effort, no increased  work of breathing. GI: Abdomen is soft, nontender, nondistended, no abdominal masses GU: No CVA tenderness.  No bladder or levator tenderness.  No prolapse or stress incontinence Skin: No rashes, bruises or suspicious lesions. Lymph: No cervical or inguinal adenopathy. Neurologic: Grossly intact, no focal deficits, moving all 4 extremities. Psychiatric: Normal mood and affect.  Laboratory Data:      Lab Results  Component Value Date   WBC 10.6 (H) 07/16/2019   HGB 12.8 07/16/2019   HCT 37.0 07/16/2019   MCV 87.3 07/16/2019   PLT 263 07/16/2019         Lab Results  Component Value Date   CREATININE 0.69 07/16/2019    No results found for: PSA  No results found for: TESTOSTERONE  No results found for: HGBA1C  Urinalysis         Component Value Date/Time   COLORURINE AMBER (A) 07/16/2019 0531   APPEARANCEUR HAZY (A) 07/16/2019 0531   APPEARANCEUR Clear 12/11/2012 1513   LABSPEC 1.020 07/16/2019 0531   LABSPEC 1.009 12/11/2012 1513   PHURINE 6.0 07/16/2019 0531   GLUCOSEU NEGATIVE 07/16/2019 0531   GLUCOSEU Negative 12/11/2012 1513   HGBUR SMALL (A) 07/16/2019 0531   BILIRUBINUR NEGATIVE 07/16/2019 0531   BILIRUBINUR Negative 12/11/2012 1513   KETONESUR NEGATIVE 07/16/2019 0531   PROTEINUR NEGATIVE 07/16/2019 0531   NITRITE POSITIVE (A) 07/16/2019 0531   LEUKOCYTESUR NEGATIVE 07/16/2019 0531   LEUKOCYTESUR Negative 12/11/2012 1513    Pertinent Imaging: Urine reviewed.  Urine sent for culture.  Chart reviewed.   Assessment & Plan: Patient may have interstitial cystitis.  Hydrodistention discussed.  Usual template discussed.  Patient has reactive hepatitis and diverticulitis but she says this pain is different and much lower in the pelvis.  The pain with intercourse is when her bladder is more full  She has a lot of allergies and is obviously taken a lot of pain medicine over the years 1. Dysuria

## 2020-02-22 NOTE — Op Note (Signed)
Preoperative diagnosis: Chronic cystitis Postoperative diagnosis: Chronic interstitial cystitis Surgery: Cystoscopy and bladder hydrodistention and bladder instillation treatment Surgeon: Dr. Lorin Picket Angeleen Horney   Patient has the above diagnosis and consented the above procedure.  Extra care was taken with leg positioning.  Preoperative antibiotics given.  24 French cystoscope utilized.  Bladder mucosa and trigone were normal.  No stitch or foreign body or carcinoma.  Hydrodistended 900 mL.  Bladder was emptied.  On reinspection she had impressive diffuse glomerulations throughout the bladder.  No ulcers.  Bladder was emptied  As a separate procedure very rubber catheter was inserted and 10 cc of 0.5% Marcaine was instilled.  The patient will be treated for interstitial cystitis

## 2020-02-22 NOTE — H&P (View-Only) (Signed)
No change cv exam normal resp exam normaml

## 2020-02-22 NOTE — Progress Notes (Signed)
No change cv exam normal resp exam normaml 

## 2020-02-22 NOTE — Anesthesia Preprocedure Evaluation (Addendum)
Anesthesia Evaluation  Patient identified by MRN, date of birth, ID band Patient awake    Reviewed: Allergy & Precautions, H&P , NPO status , Patient's Chart, lab work & pertinent test results  History of Anesthesia Complications (+) PONV and history of anesthetic complications  Airway Mallampati: I  TM Distance: >3 FB Neck ROM: full    Dental  (+) Dental Advidsory Given, Caps, Teeth Intact   Pulmonary neg shortness of breath, asthma , neg sleep apnea, neg COPD, neg recent URI,    Pulmonary exam normal        Cardiovascular (-) angina(-) Past MI and (-) Cardiac Stents negative cardio ROS Normal cardiovascular exam(-) dysrhythmias      Neuro/Psych  Headaches, neg Seizures PSYCHIATRIC DISORDERS Anxiety    GI/Hepatic Neg liver ROS, PUD, GERD  ,  Endo/Other  neg diabetesHypothyroidism   Renal/GU Renal disease (kidney stones)     Musculoskeletal   Abdominal   Peds  Hematology negative hematology ROS (+)   Anesthesia Other Findings Past Medical History: No date: Alpha 1-antitrypsin PiMS phenotype No date: Anxiety No date: Asthma No date: Attention deficit disorder 03/18/2016: Diverticulitis No date: Duodenal ulcer No date: Endometriosis No date: Headache     Comment:  migraines No date: Hepatopathy     Comment:  reactive hepatopathy No date: History of kidney stones No date: Hypothyroidism No date: PONV (postoperative nausea and vomiting) No date: Thyroid disease No date: Tortuous colon  Past Surgical History: No date: ABDOMINAL HYSTERECTOMY No date: ABDOMINAL SURGERY     Comment:  laprascopic - for endometriosis No date: APPENDECTOMY No date: CHOLECYSTECTOMY 2016: COLONOSCOPY 1999: COLONOSCOPY 2021: ESOPHAGOGASTRODUODENOSCOPY No date: LIVER BIOPSY 10/2012: THYROIDECTOMY     Reproductive/Obstetrics negative OB ROS                            Anesthesia Physical Anesthesia  Plan  ASA: II  Anesthesia Plan: General   Post-op Pain Management:    Induction: Intravenous  PONV Risk Score and Plan: Dexamethasone, Ondansetron, Midazolam, Treatment may vary due to age or medical condition, Propofol infusion, TIVA, Promethazine and Scopolamine patch - Pre-op  Airway Management Planned: LMA  Additional Equipment:   Intra-op Plan:   Post-operative Plan: Extubation in OR  Informed Consent: I have reviewed the patients History and Physical, chart, labs and discussed the procedure including the risks, benefits and alternatives for the proposed anesthesia with the patient or authorized representative who has indicated his/her understanding and acceptance.     Dental Advisory Given  Plan Discussed with: Anesthesiologist, CRNA and Surgeon  Anesthesia Plan Comments:        Anesthesia Quick Evaluation

## 2020-02-23 ENCOUNTER — Encounter: Payer: Self-pay | Admitting: Urology

## 2020-02-23 NOTE — Anesthesia Postprocedure Evaluation (Signed)
Anesthesia Post Note  Patient: Amber Booth  Procedure(s) Performed: CYSTOSCOPY/HYDRODISTENSION WITH MARCAINE (N/A )  Patient location during evaluation: PACU Anesthesia Type: General Level of consciousness: awake and alert Pain management: pain level controlled Vital Signs Assessment: post-procedure vital signs reviewed and stable Respiratory status: spontaneous breathing, nonlabored ventilation, respiratory function stable and patient connected to nasal cannula oxygen Cardiovascular status: blood pressure returned to baseline and stable Postop Assessment: no apparent nausea or vomiting Anesthetic complications: no   No complications documented.   Last Vitals:  Vitals:   02/22/20 1339 02/22/20 1344  BP: 120/74 (P) 102/70  Pulse: 68 (P) 64  Resp: 12 (P) 16  Temp: (!) 36.2 C (!) (P) 36.3 C  SpO2: 100% (P) 99%    Last Pain:  Vitals:   02/23/20 0826  TempSrc:   PainSc: 0-No pain                 Lenard Simmer

## 2020-02-24 ENCOUNTER — Telehealth: Payer: Self-pay

## 2020-02-24 NOTE — Telephone Encounter (Signed)
sw pt, she is doing well from surgery. She stated she needed a follow up appointment. I did schedule her on Monday 02/29/2020

## 2020-02-29 ENCOUNTER — Encounter: Payer: Self-pay | Admitting: Urology

## 2020-02-29 ENCOUNTER — Other Ambulatory Visit: Payer: Self-pay

## 2020-02-29 ENCOUNTER — Ambulatory Visit (INDEPENDENT_AMBULATORY_CARE_PROVIDER_SITE_OTHER): Payer: 59 | Admitting: Urology

## 2020-02-29 VITALS — BP 121/81 | HR 98 | Ht 68.0 in | Wt 167.2 lb

## 2020-02-29 DIAGNOSIS — N3020 Other chronic cystitis without hematuria: Secondary | ICD-10-CM

## 2020-02-29 NOTE — Patient Instructions (Signed)
Eating Plan for Interstitial Cystitis Interstitial cystitis (IC) is a long-term (chronic) condition that causes pain and pressure in the bladder, the lower abdomen, and the pelvic area. Other symptoms of IC include urinary urgency and frequency. Symptoms tend to come and go. Many people with IC find that certain foods trigger their symptoms. Different foods may be problematic for different people. Some foods are more likely to cause symptoms than others. Learning which foods bother you and which do not can help you come up with an eating plan to manage IC. What are tips for following this plan? You may find it helpful to work with a dietitian. This health care provider can help you develop your eating plan by doing an elimination diet, which involves these steps:  Start with a list of foods that you think trigger your IC symptoms along with the foods that most commonly trigger symptoms for many people with IC.  Eliminate those foods from your diet for about one month, then start reintroducing the foods one at a time to see which ones trigger your symptoms.  Make a list of the foods that trigger your symptoms. It may take several months to find out which foods bother you. Reading food labels Once you know which foods trigger your IC symptoms, you can avoid them. However, it is also a good idea to read food labels because some foods that trigger your symptoms may be included as ingredients in other foods. These ingredients may include:  Chili peppers.  Tomato products.  Soy.  Worcestershire sauce.  Vinegar.  Alcohol.  Citrus flavors or juices.  Artificial sweeteners.  Monosodium glutamate. Shopping  Shopping can be a challenge if many foods trigger your IC. When you go grocery shopping, bring a list of the foods you can eat.  You can get an app for your phone that lets you know which foods are the safest and which you may want to avoid. You can find the app at the Interstitial  Cystitis Network website: www.ic-network.com Meal planning  Plan your meals according to the results of your elimination diet. If you have not done an elimination diet, plan meals according to IC food lists recommended by your health care provider or dietitian. These lists tell you which foods are least and most likely to cause symptoms.  Avoid certain types of food when you go out to eat, such as pizza and foods typically served at Indian, Mexican, and Thai restaurants. These foods often contain ingredients that can aggravate IC. General information Here are some general guidelines for an IC eating plan:  Do not eat large portions.  Drink plenty of fluids with your meals.  Do not eat foods that are high in sugar, salt, or saturated fat.  Choose whole fruits instead of juice.  Eat a colorful variety of vegetables. What foods should I eat? For people with IC, the best diet is a balanced one that includes things from all the food groups. Even if you have to avoid certain foods, there are still plenty of healthy choices in each group. The following are some foods that are least bothersome and may be safest to eat: Fruits Bananas. Blueberries and blueberry juice. Melons. Pears. Apples. Dates. Prunes. Raisins. Apricots. Vegetables Asparagus. Avocado. Celery. Beets. Bell peppers. Black olives. Broccoli. Brussels sprouts. Cabbage. Carrots. Cauliflower. Cucumber. Eggplant. Green beans. Potatoes. Radishes. Spinach. Squash. Turnips. Zucchini. Mushrooms. Peas. Grains Oats. Rice. Bran. Oatmeal. Whole wheat bread. Meats and other proteins Beef. Fish and other seafood. Eggs. Nuts.   Peanut butter. Pork. Poultry. Lamb. Garbanzo beans. Pinto beans. Dairy Whole or low-fat milk. American, mozzarella, mild cheddar, feta, ricotta, and cream cheeses. The items listed above may not be a complete list of foods and beverages you can eat. Contact a dietitian for more information. What foods should I avoid? You  should avoid any foods that seem to trigger your symptoms. It is also a good idea to avoid foods that are most likely to cause symptoms in many people with IC. These include the following: Fruits Citrus fruits, including lemons, limes, oranges, and grapefruit. Cranberries. Strawberries. Pineapple. Kiwi. Vegetables Chili peppers. Onions. Sauerkraut. Tomato and tomato products. Pickles. Grains You do not need to avoid any type of grain unless it triggers your symptoms. Meats and other proteins Precooked or cured meats, such as sausages or meat loaves. Soy products. Dairy Chocolate ice cream. Processed cheese. Yogurt. Beverages Alcohol. Chocolate drinks. Coffee. Cranberry juice. Carbonated drinks. Tea (black, green, or herbal). Tomato juice. Sports drinks. The items listed above may not be a complete list of foods and beverages you should avoid. Contact a dietitian for more information. Summary  Many people with IC find that certain foods trigger their symptoms. Different foods may be problematic for different people. Some foods are more likely to cause symptoms than others.  You may find it helpful to work with a dietitian to do an elimination diet and come up with an eating plan that is right for you.  Plan your meals according to the results of your elimination diet. If you have not done an elimination diet, plan your meals using IC food lists. These lists tell you which foods are least and most likely to cause symptoms.  The best diet for people with IC is a balanced diet that includes foods from all the food groups. Even if you have to avoid certain foods, there are still plenty of healthy choices in each group. This information is not intended to replace advice given to you by your health care provider. Make sure you discuss any questions you have with your health care provider. Document Revised: 10/09/2018 Document Reviewed: 02/20/2018 Elsevier Patient Education  2020 Elsevier Inc.  

## 2020-02-29 NOTE — Progress Notes (Signed)
02/29/2020 9:07 AM   [name] [birth year] [id number]  Referring provider: Jerl Mina, MD [address] [facility] [place],  [place] [address]  Chief Complaint  Patient presents with  . Routine Post Op    HPI: I was consulted to assist the patient's lower abdominal pain. Once or twice a month she gets significant suprapubic pain worse with a full bladder partially relieved by voiding. It will last 3 or 4 days. Azo-Standard helps minimally. Sometimes she has painful intercourse.  She can void every 2 hours gets up 2-3 times at night. Her flow is variable. Sometimes she does not feel empty.  She has had a hysterectomy  Today Frequency stable.  Patient had impressive findings of interstitial cystitis on hydrodistention February 22, 2020  Pain is nearly gone.  Clinically not infected  I went over full multimodality therapy discussion on interstitial cystitis.  She chose just to take the diet at this stage and not do physical therapy or bladder rescue treatments because she is almost pain-free.  She wants to see me in 6 months and call sooner if symptoms return.  We talked about pain pathways and treating them early.   PMH: Past Medical History:  Diagnosis Date  . Alpha 1-antitrypsin PiMS phenotype   . Anxiety   . Asthma   . Attention deficit disorder   . Diverticulitis 03/18/2016  . Duodenal ulcer   . Endometriosis   . Headache    migraines  . Hepatopathy    reactive hepatopathy  . History of kidney stones   . Hypothyroidism   . PONV (postoperative nausea and vomiting)   . Thyroid disease   . Tortuous colon     Surgical History: Past Surgical History:  Procedure Laterality Date  . ABDOMINAL HYSTERECTOMY    . ABDOMINAL SURGERY     laprascopic - for endometriosis  . APPENDECTOMY    . CHOLECYSTECTOMY    . COLONOSCOPY  2016  . COLONOSCOPY  1999  . CYSTO WITH HYDRODISTENSION N/A 02/22/2020   Procedure: CYSTOSCOPY/HYDRODISTENSION WITH MARCAINE;   Surgeon: Alfredo Martinez, MD;  Location: [facility] ORS;  Service: Urology;  Laterality: N/A;  . ESOPHAGOGASTRODUODENOSCOPY  2021  . LIVER BIOPSY    . THYROIDECTOMY  10/2012    Home Medications:  Allergies as of 02/29/2020      Reactions   Codeine Nausea And Vomiting   Demerol [meperidine] Nausea And Vomiting, Other (See Comments)   Also causes blood pressure to drop too low.   Doxycycline    Per pt, Dr. Marva Panda states "liver function increases" and to avoid   Nortriptyline Other (See Comments)   NIGHTMARES   Oxycodone Nausea And Vomiting   Etodolac Nausea Only   Percocet [oxycodone-acetaminophen] Rash      Medication List       Accurate as of February 29, 2020  9:07 AM. If you have any questions, ask your nurse or doctor.        albuterol 108 (90 Base) MCG/ACT inhaler Commonly known as: VENTOLIN HFA Inhale 2 puffs into the lungs every 6 (six) hours as needed for wheezing or shortness of breath.   ALPRAZolam 0.5 MG tablet Commonly known as: XANAX Take 0.25 mg by mouth daily as needed for anxiety.   amphetamine-dextroamphetamine 30 MG 24 hr capsule Commonly known as: ADDERALL XR Take 30 mg by mouth daily.   aspirin EC 81 MG tablet Take 81 mg by mouth daily. Swallow whole.   calcium carbonate 1500 (600 Ca) MG  Tabs tablet Commonly known as: OSCAL Take 600 mg of elemental calcium by mouth every other day. Take on the same days as Magnesium   calcium carbonate 500 MG chewable tablet Commonly known as: TUMS - dosed in mg elemental calcium Chew 2 tablets by mouth daily as needed for indigestion or heartburn.   cetirizine 10 MG tablet Commonly known as: ZYRTEC Take 10 mg by mouth daily.   cyanocobalamin 1000 MCG/ML injection Commonly known as: (VITAMIN B-12) Inject 1 mL into the muscle every 30 (thirty) days.   cycloSPORINE 0.05 % ophthalmic emulsion Commonly known as: RESTASIS Place 1 drop into both eyes 2 (two) times daily.   ibuprofen 200 MG tablet Commonly known  as: ADVIL Take 600 mg by mouth every 6 (six) hours as needed for headache.   Magnesium 400 MG Tabs Take 400 mg by mouth every other day.   omeprazole 20 MG capsule Commonly known as: PRILOSEC Take 20 mg by mouth daily.   Probiotic Chew Chew 1 capsule by mouth daily.   SUMAtriptan 100 MG tablet Commonly known as: IMITREX Take 100 mg by mouth as needed for migraine. May repeat in 2 hours if headache persists or recurs.   thyroid 120 MG tablet Commonly known as: ARMOUR Take 120 mg by mouth daily before breakfast.   traMADol 50 MG tablet Commonly known as: Ultram Take 1 tablet (50 mg total) by mouth every 6 (six) hours as needed.       Allergies:  Allergies  Allergen Reactions  . Codeine Nausea And Vomiting  . Demerol [Meperidine] Nausea And Vomiting and Other (See Comments)    Also causes blood pressure to drop too low.  . Doxycycline     Per pt, Dr. Marva Panda states "liver function increases" and to avoid  . Nortriptyline Other (See Comments)    NIGHTMARES  . Oxycodone Nausea And Vomiting  . Etodolac Nausea Only  . Percocet [Oxycodone-Acetaminophen] Rash    Family History: Family History  Problem Relation Age of Onset  . Asthma Mother   . Hypertension Mother   . Lung cancer Father   . Colon cancer Father   . COPD Father   . Breast cancer Maternal Aunt 65       75  . Breast cancer Cousin   . Breast cancer Maternal Grandmother   . Breast cancer Maternal Aunt     Social History:  reports that she has never smoked. She has never used smokeless tobacco. She reports that she does not drink alcohol. No history on file for drug use.  ROS:                                        Physical Exam: BP 121/81 (BP Location: Left Arm, Patient Position: Sitting, Cuff Size: Normal)   Pulse 98   Ht 5\' 8"  (1.727 m)   Wt 75.8 kg   BMI 25.42 kg/m     Laboratory Data: Lab Results  Component Value Date   WBC 10.6 (H) 07/16/2019   HGB 12.8  07/16/2019   HCT 37.0 07/16/2019   MCV 87.3 07/16/2019   PLT 263 07/16/2019    Lab Results  Component Value Date   CREATININE 0.69 07/16/2019    No results found for: PSA  No results found for: TESTOSTERONE  No results found for: HGBA1C  Urinalysis    Component Value Date/Time   COLORURINE AMBER (A)  07/16/2019 0531   APPEARANCEUR Clear 01/25/2020 0839   LABSPEC 1.020 07/16/2019 0531   LABSPEC 1.009 12/11/2012 1513   PHURINE 6.0 07/16/2019 0531   GLUCOSEU Negative 01/25/2020 0839   GLUCOSEU Negative 12/11/2012 1513   HGBUR SMALL (A) 07/16/2019 0531   BILIRUBINUR Negative 01/25/2020 0839   BILIRUBINUR Negative 12/11/2012 1513   KETONESUR NEGATIVE 07/16/2019 0531   PROTEINUR Negative 01/25/2020 0839   PROTEINUR NEGATIVE 07/16/2019 0531   NITRITE Negative 01/25/2020 0839   NITRITE POSITIVE (A) 07/16/2019 0531   LEUKOCYTESUR Negative 01/25/2020 0839   LEUKOCYTESUR NEGATIVE 07/16/2019 0531   LEUKOCYTESUR Negative 12/11/2012 1513    Pertinent Imaging:   Assessment & Plan: Reassess in 6 months.  He does have liver issues and would rather not take oral medications.  Bladder rescue treatments, medical cocktails, bladder medication for interstitial cystitis with a high side effects discussed, diet discussed.  Rescue treatments discussed.  There are no diagnoses linked to this encounter.  No follow-ups on file.  Martina Sinner, MD  [facility] [address] [place], [place] [address] [phone]

## 2020-04-29 ENCOUNTER — Other Ambulatory Visit: Payer: Self-pay

## 2020-04-29 ENCOUNTER — Emergency Department: Payer: 59

## 2020-04-29 DIAGNOSIS — J45909 Unspecified asthma, uncomplicated: Secondary | ICD-10-CM | POA: Diagnosis not present

## 2020-04-29 DIAGNOSIS — S0990XA Unspecified injury of head, initial encounter: Secondary | ICD-10-CM | POA: Diagnosis present

## 2020-04-29 DIAGNOSIS — E039 Hypothyroidism, unspecified: Secondary | ICD-10-CM | POA: Diagnosis not present

## 2020-04-29 DIAGNOSIS — S060X1A Concussion with loss of consciousness of 30 minutes or less, initial encounter: Secondary | ICD-10-CM | POA: Diagnosis not present

## 2020-04-29 DIAGNOSIS — R0781 Pleurodynia: Secondary | ICD-10-CM | POA: Diagnosis not present

## 2020-04-29 DIAGNOSIS — Z79899 Other long term (current) drug therapy: Secondary | ICD-10-CM | POA: Diagnosis not present

## 2020-04-29 DIAGNOSIS — W208XXA Other cause of strike by thrown, projected or falling object, initial encounter: Secondary | ICD-10-CM | POA: Diagnosis not present

## 2020-04-29 DIAGNOSIS — Y92018 Other place in single-family (private) house as the place of occurrence of the external cause: Secondary | ICD-10-CM | POA: Insufficient documentation

## 2020-04-29 NOTE — ED Notes (Signed)
Patient states that her dizziness has become worse. Vital signs stable at this time. Patient given update on wait time.
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
## 2020-04-30 ENCOUNTER — Emergency Department
Admission: EM | Admit: 2020-04-30 | Discharge: 2020-04-30 | Disposition: A | Discharge status: Home / Self Care | Payer: 59 | Attending: Emergency Medicine | Admitting: Emergency Medicine

## 2020-04-30 DIAGNOSIS — R0781 Pleurodynia: Secondary | ICD-10-CM

## 2020-04-30 DIAGNOSIS — S0990XA Unspecified injury of head, initial encounter: Secondary | ICD-10-CM

## 2020-04-30 DIAGNOSIS — S060X1A Concussion with loss of consciousness of 30 minutes or less, initial encounter: Secondary | ICD-10-CM

## 2020-04-30 MED ORDER — CYCLOBENZAPRINE HCL 10 MG PO TABS
10.0000 mg | ORAL_TABLET | Freq: Once | ORAL | Status: AC
  Administered 2020-04-30: 10 mg via ORAL
  Filled 2020-04-30: qty 1

## 2020-04-30 MED ORDER — CYCLOBENZAPRINE HCL 10 MG PO TABS: 10.0000 mg | ORAL_TABLET | Freq: Three times a day (TID) | ORAL | 0 refills | Status: AC | PRN
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
## 2020-04-30 NOTE — Discharge Instructions (Signed)
Please use ibuprofen or Tylenol for any continued pain. 

## 2020-05-05 ENCOUNTER — Other Ambulatory Visit (HOSPITAL_COMMUNITY): Payer: Self-pay | Admitting: Neurology

## 2020-05-05 ENCOUNTER — Other Ambulatory Visit: Payer: Self-pay | Admitting: Neurology

## 2020-05-05 DIAGNOSIS — S069X1D Unspecified intracranial injury with loss of consciousness of 30 minutes or less, subsequent encounter: Secondary | ICD-10-CM

## 2020-05-06 ENCOUNTER — Other Ambulatory Visit: Payer: Self-pay | Admitting: Neurology

## 2020-05-06 DIAGNOSIS — S069X1A Unspecified intracranial injury with loss of consciousness of 30 minutes or less, initial encounter: Secondary | ICD-10-CM

## 2020-05-06 DIAGNOSIS — S069X1D Unspecified intracranial injury with loss of consciousness of 30 minutes or less, subsequent encounter: Secondary | ICD-10-CM

## 2020-05-11 ENCOUNTER — Ambulatory Visit
Admission: RE | Admit: 2020-05-11 | Discharge: 2020-05-11 | Disposition: A | Discharge status: Home / Self Care | Payer: 59 | Source: Ambulatory Visit | Attending: Neurology | Admitting: Neurology

## 2020-05-11 ENCOUNTER — Other Ambulatory Visit: Payer: Self-pay

## 2020-05-11 DIAGNOSIS — S069X1D Unspecified intracranial injury with loss of consciousness of 30 minutes or less, subsequent encounter: Secondary | ICD-10-CM | POA: Insufficient documentation

## 2020-05-19 ENCOUNTER — Ambulatory Visit
Admission: RE | Admit: 2020-05-19 | Discharge: 2020-05-19 | Disposition: A | Discharge status: Home / Self Care | Payer: 59 | Source: Ambulatory Visit | Attending: Neurology | Admitting: Neurology

## 2020-05-19 ENCOUNTER — Other Ambulatory Visit: Payer: Self-pay

## 2020-05-19 DIAGNOSIS — S069X1D Unspecified intracranial injury with loss of consciousness of 30 minutes or less, subsequent encounter: Secondary | ICD-10-CM

## 2020-05-19 MED ORDER — GADOBUTROL 1 MMOL/ML IV SOLN
7.0000 mL | Freq: Once | INTRAVENOUS | Status: AC | PRN
  Administered 2020-05-19: 7 mL via INTRAVENOUS

## 2020-07-20 ENCOUNTER — Encounter: Payer: Self-pay | Admitting: Dermatology

## 2020-07-20 ENCOUNTER — Other Ambulatory Visit: Payer: Self-pay

## 2020-07-20 ENCOUNTER — Ambulatory Visit (INDEPENDENT_AMBULATORY_CARE_PROVIDER_SITE_OTHER): Payer: Self-pay | Admitting: Dermatology

## 2020-07-20 DIAGNOSIS — L988 Other specified disorders of the skin and subcutaneous tissue: Secondary | ICD-10-CM

## 2020-07-20 NOTE — Progress Notes (Signed)
   New Patient Visit  Subjective  Amber Booth is a 50 y.o. female who presents for the following: Facial Elastosis (Patient here today for Botox at forehead and brow lift. She has had Botox previously but is new to our office. ).  The following portions of the chart were reviewed this encounter and updated as appropriate:   Tobacco  Allergies  Meds  Problems  Med Hx  Surg Hx  Fam Hx      Review of Systems:  No other skin or systemic complaints except as noted in HPI or Assessment and Plan.  Objective  Well appearing patient in no apparent distress; mood and affect are within normal limits.  A focused examination was performed including face. Relevant physical exam findings are noted in the Assessment and Plan.  Objective  Face: Rhytides and volume loss.   Images               Assessment & Plan  Elastosis of skin Face  Botox Injection - Face Location: See attached image  Informed consent: Discussed risks (infection, pain, bleeding, bruising, swelling, allergic reaction, paralysis of nearby muscles, eyelid droop, double vision, neck weakness, difficulty breathing, headache, undesirable cosmetic result, and need for additional treatment) and benefits of the procedure, as well as the alternatives.  Informed consent was obtained.  Preparation: The area was cleansed with alcohol.  Procedure Details:  Botox was injected into the dermis with a 30-gauge needle. Pressure applied to any bleeding. Ice packs offered for swelling.  Lot Number:  [id number] Expiration:  08/2022  Total Units Injected:  55.5  Plan: Patient was instructed to remain upright for 4 hours. Patient was instructed to avoid massaging the face and avoid vigorous exercise for the rest of the day. Tylenol may be used for headache.  Allow 2 weeks before returning to clinic for additional dosing as needed. Patient will call for any problems.   Return in about 2 weeks (around 08/03/2020) for Botox  follow up.  Anise Salvo, RMA, am acting as scribe for Darden Dates, MD .  Documentation: I have reviewed the above documentation for accuracy and completeness, and I agree with the above.  Darden Dates, MD

## 2020-07-26 ENCOUNTER — Other Ambulatory Visit: Payer: Self-pay | Admitting: Family Medicine

## 2020-07-26 DIAGNOSIS — Z1231 Encounter for screening mammogram for malignant neoplasm of breast: Secondary | ICD-10-CM

## 2020-08-03 ENCOUNTER — Ambulatory Visit (INDEPENDENT_AMBULATORY_CARE_PROVIDER_SITE_OTHER): Payer: Self-pay | Admitting: Dermatology

## 2020-08-03 ENCOUNTER — Other Ambulatory Visit: Payer: Self-pay

## 2020-08-03 DIAGNOSIS — L988 Other specified disorders of the skin and subcutaneous tissue: Secondary | ICD-10-CM

## 2020-08-03 NOTE — Progress Notes (Addendum)
   Follow-Up Visit   Subjective  Amber Booth is a 50 y.o. female who presents for the following: Facial Elastosis (Patient is here today to follow up on her recent Botox injections. ).   The following portions of the chart were reviewed this encounter and updated as appropriate:   Tobacco  Allergies  Meds  Problems  Med Hx  Surg Hx  Fam Hx     Review of Systems:  No other skin or systemic complaints except as noted in HPI or Assessment and Plan.  Objective  Well appearing patient in no apparent distress; mood and affect are within normal limits.  A focused examination was performed including the face. Relevant physical exam findings are noted in the Assessment and Plan.  Objective  Face: Rhytides and volume loss.   Images                 Assessment & Plan  Elastosis of skin Face  Botox Injection - Face Location: 1.5 units to 5 sites at forehead (M configuration) and 1 unit to bilateral lower eyelid (see attached) Informed consent: Discussed risks (infection, pain, bleeding, bruising, swelling, allergic reaction, paralysis of nearby muscles, eyelid droop, double vision, neck weakness, difficulty breathing, headache, undesirable cosmetic result, and need for additional treatment) and benefits of the procedure, as well as the alternatives.  Informed consent was obtained.  Preparation: The area was cleansed with alcohol.  Procedure Details:  Botox was injected into the dermis with a 30-gauge needle. Pressure applied to any bleeding. Ice packs offered for swelling.  Lot Number:  [id number] Expiration:  08/2022  Total Units Injected: 9.5  Plan: Patient was instructed to remain upright for 4 hours. Patient was instructed to avoid massaging the face and avoid vigorous exercise for the rest of the day. Tylenol may be used for headache.  Allow 2 weeks before returning to clinic for additional dosing as needed. Patient will call for any problems.    Return in  about 3 months (around 10/31/2020) for cosmetic - Botox.  Maylene Roes, CMA, am acting as scribe for Darden Dates, MD .  Documentation: I have reviewed the above documentation for accuracy and completeness, and I agree with the above.  Darden Dates, MD

## 2020-08-24 ENCOUNTER — Ambulatory Visit
Admission: RE | Admit: 2020-08-24 | Discharge: 2020-08-24 | Disposition: A | Discharge status: Home / Self Care | Payer: 59 | Source: Ambulatory Visit | Attending: Family Medicine | Admitting: Family Medicine

## 2020-08-24 ENCOUNTER — Other Ambulatory Visit: Payer: Self-pay

## 2020-08-24 DIAGNOSIS — Z1231 Encounter for screening mammogram for malignant neoplasm of breast: Secondary | ICD-10-CM | POA: Insufficient documentation

## 2020-08-29 ENCOUNTER — Encounter: Payer: Self-pay | Admitting: Urology

## 2020-08-29 ENCOUNTER — Encounter: Payer: Self-pay | Admitting: Dermatology

## 2020-08-29 ENCOUNTER — Ambulatory Visit: Payer: Self-pay | Admitting: Urology

## 2020-09-01 ENCOUNTER — Other Ambulatory Visit: Payer: Self-pay | Admitting: Family Medicine

## 2020-09-01 DIAGNOSIS — N6320 Unspecified lump in the left breast, unspecified quadrant: Secondary | ICD-10-CM

## 2020-09-01 DIAGNOSIS — N6310 Unspecified lump in the right breast, unspecified quadrant: Secondary | ICD-10-CM

## 2020-09-01 DIAGNOSIS — R928 Other abnormal and inconclusive findings on diagnostic imaging of breast: Secondary | ICD-10-CM

## 2020-09-08 ENCOUNTER — Ambulatory Visit
Admission: RE | Admit: 2020-09-08 | Discharge: 2020-09-08 | Disposition: A | Discharge status: Home / Self Care | Payer: 59 | Source: Ambulatory Visit | Attending: Family Medicine | Admitting: Family Medicine

## 2020-09-08 ENCOUNTER — Other Ambulatory Visit: Payer: Self-pay

## 2020-09-08 DIAGNOSIS — N6320 Unspecified lump in the left breast, unspecified quadrant: Secondary | ICD-10-CM

## 2020-09-08 DIAGNOSIS — N6310 Unspecified lump in the right breast, unspecified quadrant: Secondary | ICD-10-CM

## 2020-09-08 DIAGNOSIS — R928 Other abnormal and inconclusive findings on diagnostic imaging of breast: Secondary | ICD-10-CM | POA: Insufficient documentation

## 2020-11-10 ENCOUNTER — Telehealth: Payer: 59 | Admitting: Physician Assistant

## 2020-11-10 ENCOUNTER — Other Ambulatory Visit: Payer: Self-pay

## 2020-11-10 ENCOUNTER — Encounter: Payer: Self-pay | Admitting: Physician Assistant

## 2020-11-10 ENCOUNTER — Telehealth: Payer: Self-pay | Admitting: Unknown Physician Specialty

## 2020-11-10 DIAGNOSIS — U071 COVID-19: Secondary | ICD-10-CM | POA: Diagnosis not present

## 2020-11-10 MED ORDER — ALBUTEROL SULFATE HFA 108 (90 BASE) MCG/ACT IN AERS: 2.0000 | INHALATION_SPRAY | Freq: Four times a day (QID) | RESPIRATORY_TRACT | 0 refills | Status: AC | PRN

## 2020-11-10 MED ORDER — BENZONATATE 100 MG PO CAPS: 100.0000 mg | ORAL_CAPSULE | Freq: Three times a day (TID) | ORAL | 0 refills | Status: AC | PRN

## 2020-11-10 MED ORDER — NIRMATRELVIR/RITONAVIR (PAXLOVID)TABLET
3.0000 | ORAL_TABLET | Freq: Two times a day (BID) | ORAL | 0 refills | Status: AC
  Filled 2020-11-10: qty 30, 5d supply, fill #0

## 2020-11-10 MED ORDER — PREDNISONE 10 MG (21) PO TBPK: ORAL_TABLET | ORAL | 0 refills | Status: AC

## 2020-11-10 NOTE — Patient Instructions (Signed)
Hello Amber Booth,  You are being placed in the home monitoring program for COVID-19 (commonly known as Coronavirus).  This is because you are suspected to have the virus or are known to have the virus.  If you are unsure which group you fall into call your clinic.    As part of this program, you'll answer a daily questionnaire in the MyChart mobile app. You'll receive a notification through the MyChart app when the questionnaire is available. When you log in to MyChart, you'll see the tasks in your To Do activity.       Clinicians will see any answers that are concerning and take appropriate steps.  If at any point you are having a medical emergency, call 911.  If otherwise concerned call your clinic instead of coming into the clinic or hospital.  To keep from spreading the disease you should: Stay home and limit contact with other people as much as possible.  Wash your hands frequently. Cover your coughs and sneezes with a tissue, and throw used tissues in the trash.   Clean and disinfect frequently touched surfaces and objects.    Take care of yourself by: Staying home Resting Drinking fluids Take fever-reducing medications (Tylenol/Acetaminophen and Ibuprofen)  For more information on the disease go to the Centers for Disease Control and Prevention website     Can take to lessen severity: Vit C 500mg twice daily Quercertin 250-500mg twice daily Zinc 75-100mg daily Melatonin 3-6 mg at bedtime Vit D3 1000-2000 IU daily Aspirin 81 mg daily with food Optional: Famotidine 20mg daily Also can add tylenol/ibuprofen as needed for fevers and body aches May add Mucinex or Mucinex DM as needed for cough/congestion   10 Things You Can Do to Manage Your COVID-19 Symptoms at Home If you have possible or confirmed COVID-19: 1. Stay home except to get medical care. 2. Monitor your symptoms carefully. If your symptoms get worse, call your healthcare provider immediately. 3. Get rest and stay  hydrated. 4. If you have a medical appointment, call the healthcare provider ahead of time and tell them that you have or may have COVID-19. 5. For medical emergencies, call 911 and notify the dispatch personnel that you have or may have COVID-19. 6. Cover your cough and sneezes with a tissue or use the inside of your elbow. 7. Wash your hands often with soap and water for at least 20 seconds or clean your hands with an alcohol-based hand sanitizer that contains at least 60% alcohol. 8. As much as possible, stay in a specific room and away from other people in your home. Also, you should use a separate bathroom, if available. If you need to be around other people in or outside of the home, wear a mask. 9. Avoid sharing personal items with other people in your household, like dishes, towels, and bedding. 10. Clean all surfaces that are touched often, like counters, tabletops, and doorknobs. Use household cleaning sprays or wipes according to the label instructions. cdc.gov/coronavirus 01/15/2020 This information is not intended to replace advice given to you by your health care provider. Make sure you discuss any questions you have with your health care provider. Document Revised: 05/02/2020 Document Reviewed: 05/02/2020 Elsevier Patient Education  2021 Elsevier Inc.   COVID-19: What to Do if You Are Sick If you have a fever, cough or other symptoms, you might have COVID-19. Most people have mild illness and are able to recover at home. If you are sick:  Keep track of your   symptoms.  If you have an emergency warning sign (including trouble breathing), call 911. Steps to help prevent the spread of COVID-19 if you are sick If you are sick with COVID-19 or think you might have COVID-19, follow the steps below to care for yourself and to help protect other people in your home and community. Stay home except to get medical care  Stay home. Most people with COVID-19 have mild illness and can  recover at home without medical care. Do not leave your home, except to get medical care. Do not visit public areas.  Take care of yourself. Get rest and stay hydrated. Take over-the-counter medicines, such as acetaminophen, to help you feel better.  Stay in touch with your doctor. Call before you get medical care. Be sure to get care if you have trouble breathing, or have any other emergency warning signs, or if you think it is an emergency.  Avoid public transportation, ride-sharing, or taxis. Separate yourself from other people As much as possible, stay in a specific room and away from other people and pets in your home. If possible, you should use a separate bathroom. If you need to be around other people or animals in or outside of the home, wear a mask. Tell your close contactsthat they may have been exposed to COVID-19. An infected person can spread COVID-19 starting 48 hours (or 2 days) before the person has any symptoms or tests positive. By letting your close contacts know they may have been exposed to COVID-19, you are helping to protect everyone.  Additional guidance is available for those living in close quarters and shared housing.  See COVID-19 and Animals if you have questions about pets.  If you are diagnosed with COVID-19, someone from the health department may call you. Answer the call to slow the spread. Monitor your symptoms  Symptoms of COVID-19 include fever, cough, or other symptoms.  Follow care instructions from your healthcare provider and local health department. Your local health authorities may give instructions on checking your symptoms and reporting information. When to seek emergency medical attention Look for emergency warning signs* for COVID-19. If someone is showing any of these signs, seek emergency medical care immediately:  Trouble breathing  Persistent pain or pressure in the chest  New confusion  Inability to wake or stay awake  Pale, gray, or  blue-colored skin, lips, or nail beds, depending on skin tone *This list is not all possible symptoms. Please call your medical provider for any other symptoms that are severe or concerning to you. Call 911 or call ahead to your local emergency facility: Notify the operator that you are seeking care for someone who has or may have COVID-19. Call ahead before visiting your doctor  Call ahead. Many medical visits for routine care are being postponed or done by phone or telemedicine.  If you have a medical appointment that cannot be postponed, call your doctor's office, and tell them you have or may have COVID-19. This will help the office protect themselves and other patients. Get  tested  If you have symptoms of COVID-19, get tested. While waiting for test results, you stay away from others, including staying apart from those living in your household.  You can visit your state, tribal, local, and territorialhealth department's website to look for the latest local information on testing sites. If you are sick, wear a mask over your nose and mouth  You should wear a mask over your nose and mouth if you must  be around other people or animals, including pets (even at home).  You don't need to wear the mask if you are alone. If you can't put on a mask (because of trouble breathing, for example), cover your coughs and sneezes in some other way. Try to stay at least 6 feet away from other people. This will help protect the people around you.  Masks should not be placed on young children under age 53 years, anyone who has trouble breathing, or anyone who is not able to remove the mask without help. Note: During the COVID-19 pandemic, medical grade facemasks are reserved for healthcare workers and some first responders. Cover your coughs and sneezes  Cover your mouth and nose with a tissue when you cough or sneeze.  Throw away used tissues in a lined trash can.  Immediately wash your hands with soap  and water for at least 20 seconds. If soap and water are not available, clean your hands with an alcohol-based hand sanitizer that contains at least 60% alcohol. Clean your hands often  Wash your hands often with soap and water for at least 20 seconds. This is especially important after blowing your nose, coughing, or sneezing; going to the bathroom; and before eating or preparing food.  Use hand sanitizer if soap and water are not available. Use an alcohol-based hand sanitizer with at least 60% alcohol, covering all surfaces of your hands and rubbing them together until they feel dry.  Soap and water are the best option, especially if hands are visibly dirty.  Avoid touching your eyes, nose, and mouth with unwashed hands.  Handwashing Tips Avoid sharing personal household items  Do not share dishes, drinking glasses, cups, eating utensils, towels, or bedding with other people in your home.  Wash these items thoroughly after using them with soap and water or put in the dishwasher. Clean all "high-touch" surfaces everyday  Clean and disinfect high-touch surfaces in your "sick room" and bathroom; wear disposable gloves. Let someone else clean and disinfect surfaces in common areas, but you should clean your bedroom and bathroom, if possible.  If a caregiver or other person needs to clean and disinfect a sick person's bedroom or bathroom, they should do so on an as-needed basis. The caregiver/other person should wear a mask and disposable gloves prior to cleaning. They should wait as long as possible after the person who is sick has used the bathroom before coming in to clean and use the bathroom. ? High-touch surfaces include phones, remote controls, counters, tabletops, doorknobs, bathroom fixtures, toilets, keyboards, tablets, and bedside tables.  Clean and disinfect areas that may have blood, stool, or body fluids on them.  Use household cleaners and disinfectants. Clean the area or item  with soap and water or another detergent if it is dirty. Then, use a household disinfectant. ? Be sure to follow the instructions on the label to ensure safe and effective use of the product. Many products recommend keeping the surface wet for several minutes to ensure germs are killed. Many also recommend precautions such as wearing gloves and making sure you have good ventilation during use of the product. ? Use a product from [employer] List N: Disinfectants for Coronavirus (URKYH-06). ? Complete Disinfection Guidance When you can be around others after being sick with COVID-19 Deciding when you can be around others is different for different situations. Find out when you can safely end home isolation. For any additional questions about your care, contact your healthcare provider or state  or local health department. 09/16/2019 Content source: [facility] for Immunization and Respiratory Diseases ([facility]), Division of Viral Diseases This information is not intended to replace advice given to you by your health care provider. Make sure you discuss any questions you have with your health care provider. Document Revised: 05/02/2020 Document Reviewed: 05/02/2020 Elsevier Patient Education  2021 [employer].

## 2020-11-10 NOTE — Progress Notes (Signed)
Amber Booth, proffit are scheduled for a virtual visit with your provider today.    Just as we do with appointments in the office, we must obtain your consent to participate.  Your consent will be active for this visit and any virtual visit you may have with one of our providers in the next 365 days.    If you have a MyChart account, I can also send a copy of this consent to you electronically.  All virtual visits are billed to your insurance company just like a traditional visit in the office.  As this is a virtual visit, video technology does not allow for your provider to perform a traditional examination.  This may limit your provider's ability to fully assess your condition.  If your provider identifies any concerns that need to be evaluated in person or the need to arrange testing such as labs, EKG, etc, we will make arrangements to do so.    Although advances in technology are sophisticated, we cannot ensure that it will always work on either your end or our end.  If the connection with a video visit is poor, we may have to switch to a telephone visit.  With either a video or telephone visit, we are not always able to ensure that we have a secure connection.   I need to obtain your verbal consent now.   Are you willing to proceed with your visit today?   Amber Booth has provided verbal consent on 11/10/2020 for a virtual visit (video or telephone).   Amber Loveless, PA-C 11/10/2020  1:07 PM    MyChart Video Visit    Virtual Visit via Video Note   This visit type was conducted due to national recommendations for restrictions regarding the COVID-19 Pandemic (e.g. social distancing) in an effort to limit this patient's exposure and mitigate transmission in our community. This patient is at least at moderate risk for complications without adequate follow up. This format is felt to be most appropriate for this patient at this time. Physical exam was limited by quality of the video and audio  technology used for the visit.   Patient location: Home Provider location: Home office in [place]  I discussed the limitations of evaluation and management by telemedicine and the availability of in person appointments. The patient expressed understanding and agreed to proceed.  Patient: Amber Booth   DOB: [birth year]   50 y.o. Female  MRN: [id number] Visit Date: 11/10/2020  Today's healthcare provider: Margaretann Loveless, PA-C   No chief complaint on file.  Subjective    URI  This is a new problem. The current episode started today (tuesday). The problem has been gradually worsening. Associated symptoms include congestion, coughing, headaches, sinus pain, a sore throat and wheezing.    Tested positive for Covid 19 yesterday, 11/09/20 PMH: asthma  Patient Active Problem List   Diagnosis Date Noted  . Diverticulitis of colon 04/16/2016  . 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 Alpha 1-antitrypsin PiMS phenotype   . Anxiety   . Asthma   . Attention deficit disorder   . Diverticulitis 03/18/2016  . Duodenal ulcer   . Endometriosis   . Headache    migraines  . Hepatopathy    reactive hepatopathy  . History of kidney stones   . Hypothyroidism   . PONV (postoperative nausea and vomiting)   . Thyroid disease   . Tortuous colon  Medications: Outpatient Medications Prior to Visit  Medication Sig  . albuterol (VENTOLIN HFA) 108 (90 Base) MCG/ACT inhaler Inhale 2 puffs into the lungs every 6 (six) hours as needed for wheezing or shortness of breath.  . ALPRAZolam (XANAX) 0.5 MG tablet Take 0.25 mg by mouth daily as needed for anxiety.   Marland Kitchen amphetamine-dextroamphetamine (ADDERALL XR) 30 MG 24 hr capsule Take 30 mg by mouth daily.  Marland Kitchen aspirin EC 81 MG tablet Take 81 mg by mouth daily. Swallow whole.  . calcium carbonate (OSCAL) 1500 (600 Ca) MG TABS tablet Take 600 mg of elemental calcium by mouth every other day. Take on the same  days as Magnesium  . calcium carbonate (TUMS - DOSED IN MG ELEMENTAL CALCIUM) 500 MG chewable tablet Chew 2 tablets by mouth daily as needed for indigestion or heartburn.  . cetirizine (ZYRTEC) 10 MG tablet Take 10 mg by mouth daily.  . cyanocobalamin (,VITAMIN B-12,) 1000 MCG/ML injection Inject 1 mL into the muscle every 30 (thirty) days.   . cycloSPORINE (RESTASIS) 0.05 % ophthalmic emulsion Place 1 drop into both eyes 2 (two) times daily.  Marland Kitchen ibuprofen (ADVIL) 200 MG tablet Take 600 mg by mouth every 6 (six) hours as needed for headache.  . Magnesium 400 MG TABS Take 400 mg by mouth every other day.  Marland Kitchen omeprazole (PRILOSEC) 20 MG capsule Take 20 mg by mouth daily.  . Probiotic CHEW Chew 1 capsule by mouth daily.  . SUMAtriptan (IMITREX) 100 MG tablet Take 100 mg by mouth as needed for migraine. May repeat in 2 hours if headache persists or recurs.  Marland Kitchen thyroid (ARMOUR) 120 MG tablet Take 120 mg by mouth daily before breakfast.  . traMADol (ULTRAM) 50 MG tablet Take 1 tablet (50 mg total) by mouth every 6 (six) hours as needed.   No facility-administered medications prior to visit.    Review of Systems  Constitutional: Positive for appetite change, chills, fatigue and fever.  HENT: Positive for congestion, postnasal drip, sinus pressure, sinus pain and sore throat.   Respiratory: Positive for cough, chest tightness, shortness of breath and wheezing.   Cardiovascular: Negative.   Neurological: Positive for dizziness and headaches.    Last CBC Lab Results  Component Value Date   WBC 10.6 (H) 07/16/2019   HGB 12.8 07/16/2019   HCT 37.0 07/16/2019   MCV 87.3 07/16/2019   MCH 30.2 07/16/2019   RDW 12.7 07/16/2019   PLT 263 07/16/2019   Last metabolic panel Lab Results  Component Value Date   GLUCOSE 98 07/16/2019   NA 139 07/16/2019   K 4.6 07/16/2019   CL 103 07/16/2019   CO2 27 07/16/2019   BUN 13 07/16/2019   CREATININE 0.69 07/16/2019   GFRNONAA >60 07/16/2019   GFRAA >60  07/16/2019   CALCIUM 9.0 07/16/2019   PROT 7.2 07/16/2019   ALBUMIN 4.1 07/16/2019   BILITOT 1.4 (H) 07/16/2019   ALKPHOS 96 07/16/2019   AST 101 (H) 07/16/2019   ALT 127 (H) 07/16/2019   ANIONGAP 9 07/16/2019      Objective    There were no vitals taken for this visit. BP Readings from Last 3 Encounters:  04/30/20 121/75  02/29/20 121/81  02/22/20 (P) 102/70   Wt Readings from Last 3 Encounters:  04/29/20 162 lb (73.5 kg)  02/29/20 167 lb 3.2 oz (75.8 kg)  02/22/20 164 lb (74.4 kg)      Physical Exam Vitals reviewed.  Constitutional:      General:  She is not in acute distress.    Appearance: Normal appearance. She is well-developed. She is ill-appearing.  HENT:     Head: Normocephalic and atraumatic.  Pulmonary:     Effort: Pulmonary effort is normal. No respiratory distress (able to speak in full, coherent sentences without issue).  Musculoskeletal:     Cervical back: Normal range of motion and neck supple.  Neurological:     Mental Status: She is alert.  Psychiatric:        Behavior: Behavior normal.        Thought Content: Thought content normal.        Judgment: Judgment normal.        Assessment & Plan     1. COVID-19 - Continue OTC symptomatic management of choice - Albuterol inhaler, prednisone 6 day taper, and tessalon perles provided for symptom relief and for chest tightness in patient with H/O asthma - Will send OTC vitamins and supplement information through AVS - Patient enrolled in MyChart symptom monitoring - Push fluids - Rest as needed - Referral to Covid treatment team placed, consult appreciated - Discussed return precautions and when to seek in-person evaluation, sent via AVS as well - albuterol (VENTOLIN HFA) 108 (90 Base) MCG/ACT inhaler; Inhale 2 puffs into the lungs every 6 (six) hours as needed for wheezing or shortness of breath.  Dispense: 18 g; Refill: 0 - benzonatate (TESSALON) 100 MG capsule; Take 1 capsule (100 mg total) by  mouth 3 (three) times daily as needed for cough.  Dispense: 30 capsule; Refill: 0 - predniSONE (STERAPRED UNI-PAK 21 TAB) 10 MG (21) TBPK tablet; 6 day taper; take as directed on package instructions  Dispense: 21 tablet; Refill: 0 - Ambulatory referral for Covid Treatment   No follow-ups on file.     I discussed the assessment and treatment plan with the patient. The patient was provided an opportunity to ask questions and all were answered. The patient agreed with the plan and demonstrated an understanding of the instructions.   The patient was advised to call back or seek an in-person evaluation if the symptoms worsen or if the condition fails to improve as anticipated.  I provided 13 minutes of face-to-face time during this encounter via MyChart Video enabled encounter.   Reine Just [facility] [phone] (phone) [phone] (fax)  [facility]

## 2020-11-10 NOTE — Telephone Encounter (Signed)
Outpatient Oral COVID Treatment Note  I connected with Amber Booth on 11/10/2020/4:23 PM by telephone and verified that I am speaking with the correct person using two identifiers.  I discussed the limitations, risks, security, and privacy concerns of performing an evaluation and management service by telephone and the availability of in person appointments. I also discussed with the patient that there may be a patient responsible charge related to this service. The patient expressed understanding and agreed to proceed.  Patient location: home Provider location: home  Diagnosis: COVID-19 infection  Purpose of visit: Discussion of potential use of Molnupiravir or Paxlovid, a new treatment for mild to moderate COVID-19 viral infection in non-hospitalized patients.   Subjective: Patient is a 50 y.o. female who has been diagnosed with COVID 19 viral infection.  Their symptoms began on 5/10 with flu symptoms.    Past Medical History:  Diagnosis Date  . Alpha 1-antitrypsin PiMS phenotype   . Anxiety   . Asthma   . Attention deficit disorder   . Diverticulitis 03/18/2016  . Duodenal ulcer   . Endometriosis   . Headache    migraines  . Hepatopathy    reactive hepatopathy  . History of kidney stones   . Hypothyroidism   . PONV (postoperative nausea and vomiting)   . Thyroid disease   . Tortuous colon     Allergies  Allergen Reactions  . Codeine Nausea And Vomiting  . Demerol [Meperidine] Nausea And Vomiting and Other (See Comments)    Also causes blood pressure to drop too low.  . Doxycycline     Per pt, Dr. Marva Panda states "liver function increases" and to avoid  . Nortriptyline Other (See Comments)    NIGHTMARES  . Oxycodone Nausea And Vomiting  . Etodolac Nausea Only  . Percocet [Oxycodone-Acetaminophen] Rash     Current Outpatient Medications:  .  albuterol (VENTOLIN HFA) 108 (90 Base) MCG/ACT inhaler, Inhale 2 puffs into the lungs every 6 (six) hours as needed for  wheezing or shortness of breath., Disp: 18 g, Rfl: 0 .  ALPRAZolam (XANAX) 0.5 MG tablet, Take 0.25 mg by mouth daily as needed for anxiety. , Disp: , Rfl:  .  amphetamine-dextroamphetamine (ADDERALL XR) 30 MG 24 hr capsule, Take 30 mg by mouth daily., Disp: , Rfl:  .  aspirin EC 81 MG tablet, Take 81 mg by mouth daily. Swallow whole., Disp: , Rfl:  .  benzonatate (TESSALON) 100 MG capsule, Take 1 capsule (100 mg total) by mouth 3 (three) times daily as needed for cough., Disp: 30 capsule, Rfl: 0 .  calcium carbonate (OSCAL) 1500 (600 Ca) MG TABS tablet, Take 600 mg of elemental calcium by mouth every other day. Take on the same days as Magnesium, Disp: , Rfl:  .  calcium carbonate (TUMS - DOSED IN MG ELEMENTAL CALCIUM) 500 MG chewable tablet, Chew 2 tablets by mouth daily as needed for indigestion or heartburn., Disp: , Rfl:  .  cetirizine (ZYRTEC) 10 MG tablet, Take 10 mg by mouth daily., Disp: , Rfl:  .  cyanocobalamin (,VITAMIN B-12,) 1000 MCG/ML injection, Inject 1 mL into the muscle every 30 (thirty) days. , Disp: , Rfl: 2 .  cycloSPORINE (RESTASIS) 0.05 % ophthalmic emulsion, Place 1 drop into both eyes 2 (two) times daily., Disp: , Rfl:  .  ibuprofen (ADVIL) 200 MG tablet, Take 600 mg by mouth every 6 (six) hours as needed for headache., Disp: , Rfl:  .  Magnesium 400 MG TABS, Take 400  mg by mouth every other day., Disp: , Rfl:  .  omeprazole (PRILOSEC) 20 MG capsule, Take 20 mg by mouth daily., Disp: , Rfl:  .  predniSONE (STERAPRED UNI-PAK 21 TAB) 10 MG (21) TBPK tablet, 6 day taper; take as directed on package instructions, Disp: 21 tablet, Rfl: 0 .  Probiotic CHEW, Chew 1 capsule by mouth daily., Disp: , Rfl:  .  SUMAtriptan (IMITREX) 100 MG tablet, Take 100 mg by mouth as needed for migraine. May repeat in 2 hours if headache persists or recurs., Disp: , Rfl:  .  thyroid (ARMOUR) 120 MG tablet, Take 120 mg by mouth daily before breakfast., Disp: , Rfl:  .  traMADol (ULTRAM) 50 MG  tablet, Take 1 tablet (50 mg total) by mouth every 6 (six) hours as needed., Disp: 12 tablet, Rfl: 0  Objective: Patient appears/sounds well.  They are in no apparent distress.  Breathing is non labored.  Mood and behavior are normal.  Laboratory Data:  No results found for this or any previous visit (from the past 2160 hour(s)).   Assessment: 50 y.o. female with mild/moderate COVID 19 viral infection diagnosed on 5/10 at high risk for progression to severe COVID 19.  Plan:  This patient is a 50 y.o. female that meets the following criteria for Emergency Use Authorization of: Paxlovid 1. Age >12 yr AND > 40 kg 2. SARS-COV-2 positive test 3. Symptom onset < 5 days 4. Mild-to-moderate COVID disease with high risk for severe progression to hospitalization or death  I have spoken and communicated the following to the patient or parent/caregiver regarding: 1. Paxlovid is an unapproved drug that is authorized for use under an Emergency Use Authorization.  2. There are no adequate, approved, available products for the treatment of COVID-19 in adults who have mild-to-moderate COVID-19 and are at high risk for progressing to severe COVID-19, including hospitalization or death. 3. Other therapeutics are currently authorized. For additional information on all products authorized for treatment or prevention of COVID-19, please see [email or web address]-[email or web address]  4. There are benefits and risks of taking this treatment as outlined in the "Fact Sheet for Patients and Caregivers."  5. "Fact Sheet for Patients and Caregivers" was reviewed with patient. A hard copy will be provided to patient from pharmacy prior to the patient receiving treatment. 6. Patients should continue to self-isolate and use infection control measures (e.g., wear mask, isolate, social distance, avoid sharing personal items, clean and disinfect  "high touch" surfaces, and frequent handwashing) according to CDC guidelines.  7. The patient or parent/caregiver has the option to accept or refuse treatment. 8. Patient medication history was reviewed for potential drug interactions:No drug interactions 9. Patient's GFR was calculated to be >77, and they were therefore prescribed Normal dose (GFR>60) - nirmatrelvir 150mg  tab (2 tablet) by mouth twice daily AND ritonavir 100mg  tab (1 tablet) by mouth twice daily   After reviewing above information with the patient, the patient agrees to receive Paxlovid.  Follow up instructions:    . Take prescription BID x 5 days as directed . Reach out to pharmacist for counseling on medication if desired . For concerns regarding further COVID symptoms please follow up with your PCP or urgent care . For urgent or life-threatening issues, seek care at your local emergency department  The patient was provided an opportunity to ask questions, and all were answered. The patient agreed with the plan and demonstrated an understanding of the instructions.   Script sent to  The patient was advised to call their PCP or seek an in-person evaluation if the symptoms worsen or if the condition fails to improve as anticipated.   I provided 10 minutes of non face-to-face telephone visit time during this encounter, and > 50% was spent counseling as documented under my assessment & plan.  Gabriel Cirri, NP 11/10/2020 /4:23 PM

## 2020-11-11 ENCOUNTER — Other Ambulatory Visit: Payer: Self-pay

## 2021-02-27 ENCOUNTER — Encounter: Payer: Self-pay | Admitting: Urology

## 2021-04-06 ENCOUNTER — Other Ambulatory Visit: Payer: Self-pay | Admitting: Neurology

## 2021-04-06 DIAGNOSIS — R200 Anesthesia of skin: Secondary | ICD-10-CM

## 2021-04-19 ENCOUNTER — Other Ambulatory Visit: Payer: Self-pay

## 2021-04-19 ENCOUNTER — Ambulatory Visit
Admission: RE | Admit: 2021-04-19 | Discharge: 2021-04-19 | Disposition: A | Discharge status: Home / Self Care | Payer: 59 | Source: Ambulatory Visit | Attending: Neurology | Admitting: Neurology

## 2021-04-19 DIAGNOSIS — R200 Anesthesia of skin: Secondary | ICD-10-CM

## 2021-04-19 DIAGNOSIS — R202 Paresthesia of skin: Secondary | ICD-10-CM | POA: Diagnosis present

## 2021-08-30 ENCOUNTER — Other Ambulatory Visit: Payer: Self-pay | Admitting: Family Medicine

## 2021-09-01 ENCOUNTER — Other Ambulatory Visit: Payer: Self-pay | Admitting: Family Medicine

## 2021-09-01 DIAGNOSIS — N6459 Other signs and symptoms in breast: Secondary | ICD-10-CM

## 2021-09-01 DIAGNOSIS — Z87898 Personal history of other specified conditions: Secondary | ICD-10-CM

## 2021-09-27 ENCOUNTER — Ambulatory Visit
Admission: RE | Admit: 2021-09-27 | Discharge: 2021-09-27 | Disposition: A | Discharge status: Home / Self Care | Payer: 59 | Source: Ambulatory Visit | Attending: Family Medicine | Admitting: Family Medicine

## 2021-09-27 ENCOUNTER — Other Ambulatory Visit: Payer: Self-pay

## 2021-09-27 DIAGNOSIS — Z87898 Personal history of other specified conditions: Secondary | ICD-10-CM | POA: Insufficient documentation

## 2021-09-27 DIAGNOSIS — N6459 Other signs and symptoms in breast: Secondary | ICD-10-CM | POA: Diagnosis present

## 2022-07-09 ENCOUNTER — Other Ambulatory Visit: Payer: Self-pay | Admitting: Family Medicine

## 2022-07-09 DIAGNOSIS — Z1231 Encounter for screening mammogram for malignant neoplasm of breast: Secondary | ICD-10-CM

## 2022-08-04 ENCOUNTER — Other Ambulatory Visit: Payer: Self-pay

## 2022-08-04 ENCOUNTER — Emergency Department: Payer: 59

## 2022-08-04 ENCOUNTER — Encounter: Payer: Self-pay | Admitting: Emergency Medicine

## 2022-08-04 ENCOUNTER — Emergency Department
Admission: EM | Admit: 2022-08-04 | Discharge: 2022-08-04 | Disposition: A | Discharge status: Home / Self Care | Payer: 59 | Attending: Emergency Medicine | Admitting: Emergency Medicine

## 2022-08-04 DIAGNOSIS — K5901 Slow transit constipation: Secondary | ICD-10-CM | POA: Insufficient documentation

## 2022-08-04 DIAGNOSIS — R109 Unspecified abdominal pain: Secondary | ICD-10-CM

## 2022-08-04 DIAGNOSIS — R1032 Left lower quadrant pain: Secondary | ICD-10-CM | POA: Diagnosis present

## 2022-08-04 LAB — URINALYSIS, ROUTINE W REFLEX MICROSCOPIC
Bacteria, UA: NONE SEEN
Bilirubin Urine: NEGATIVE
Glucose, UA: NEGATIVE mg/dL
Ketones, ur: NEGATIVE mg/dL
Nitrite: NEGATIVE
Protein, ur: NEGATIVE mg/dL
Specific Gravity, Urine: 1.008 (ref 1.005–1.030)
Specific Gravity, Urine: 1.016 (ref 1.005–1.030)
pH: 5 (ref 5.0–8.0)

## 2022-08-04 LAB — COMPREHENSIVE METABOLIC PANEL
ALT: 22 U/L (ref 0–44)
AST: 23 U/L (ref 15–41)
Albumin: 4.3 g/dL (ref 3.5–5.0)
Alkaline Phosphatase: 69 U/L (ref 38–126)
Anion gap: 8 (ref 5–15)
BUN: 13 mg/dL (ref 6–20)
CO2: 26 mmol/L (ref 22–32)
Calcium: 9.4 mg/dL (ref 8.9–10.3)
Chloride: 103 mmol/L (ref 98–111)
Creatinine, Ser: 0.95 mg/dL (ref 0.44–1.00)
GFR, Estimated: >60 mL/min (ref >60)
Glucose, Bld: 97 mg/dL (ref 70–99)
Potassium: 4.1 mmol/L (ref 3.5–5.1)
Sodium: 137 mmol/L (ref 135–145)
Total Bilirubin: 0.9 mg/dL (ref 0.3–1.2)
Total Protein: 7.3 g/dL (ref 6.5–8.1)

## 2022-08-04 LAB — CBC
HCT: 39.7 % (ref 36.0–46.0)
Hemoglobin: 13.3 g/dL (ref 12.0–15.0)
MCH: 29.4 pg (ref 26.0–34.0)
MCHC: 33.5 g/dL (ref 30.0–36.0)
MCV: 87.8 fL (ref 80.0–100.0)
Platelets: 237 10*3/uL (ref 150–400)
RBC: 4.52 MIL/uL (ref 3.87–5.11)
RDW: 12.5 % (ref 11.5–15.5)
WBC: 5.7 10*3/uL (ref 4.0–10.5)
nRBC: 0 % (ref 0.0–0.2)

## 2022-08-04 LAB — LIPASE, BLOOD: Lipase: 33 U/L (ref 11–51)

## 2022-08-04 LAB — LACTIC ACID, PLASMA: Lactic Acid, Venous: 0.9 mmol/L (ref 0.5–1.9)

## 2022-08-04 MED ORDER — DICYCLOMINE HCL 20 MG PO TABS: 20.0000 mg | ORAL_TABLET | Freq: Three times a day (TID) | ORAL | 0 refills | Status: AC | PRN

## 2022-08-04 MED ORDER — OXYCODONE HCL 5 MG PO TABS: 5.0000 mg | ORAL_TABLET | Freq: Three times a day (TID) | ORAL | 0 refills | Status: AC | PRN

## 2022-08-04 MED ORDER — HYDROMORPHONE HCL 1 MG/ML IJ SOLN
0.5000 mg | Freq: Once | INTRAMUSCULAR | Status: AC
  Administered 2022-08-04: 0.5 mg via INTRAVENOUS
  Filled 2022-08-04: qty 0.5

## 2022-08-04 MED ORDER — ONDANSETRON HCL 4 MG/2ML IJ SOLN
4.0000 mg | Freq: Once | INTRAMUSCULAR | Status: AC
  Administered 2022-08-04: 4 mg via INTRAVENOUS
  Filled 2022-08-04: qty 2

## 2022-08-04 MED ORDER — DICYCLOMINE HCL 10 MG PO CAPS
20.0000 mg | ORAL_CAPSULE | Freq: Once | ORAL | Status: AC
  Administered 2022-08-04: 20 mg via ORAL
  Filled 2022-08-04: qty 2

## 2022-08-04 MED ORDER — IOHEXOL 300 MG/ML  SOLN
100.0000 mL | Freq: Once | INTRAMUSCULAR | Status: AC | PRN
  Administered 2022-08-04: 100 mL via INTRAVENOUS

## 2022-08-04 MED ORDER — SODIUM CHLORIDE 0.9 % IV BOLUS
1000.0000 mL | Freq: Once | INTRAVENOUS | Status: AC
  Administered 2022-08-04: 1000 mL via INTRAVENOUS

## 2022-08-04 MED ORDER — LACTULOSE 10 GM/15ML PO SOLN: 20.0000 g | Freq: Three times a day (TID) | ORAL | 0 refills | Status: AC | PRN

## 2022-08-04 MED ORDER — ONDANSETRON 4 MG PO TBDP: 4.0000 mg | ORAL_TABLET | Freq: Three times a day (TID) | ORAL | 0 refills | Status: AC | PRN

## 2022-08-04 MED ORDER — GLYCERIN (ADULT) 2 G RE SUPP: 1.0000 | RECTAL | 0 refills | Status: AC | PRN

## 2022-08-04 MED ORDER — HYDROMORPHONE HCL 1 MG/ML IJ SOLN
1.0000 mg | Freq: Once | INTRAMUSCULAR | Status: AC
Start: 2022-08-04 — End: 2022-08-04
  Administered 2022-08-04: 1 mg via INTRAVENOUS
  Filled 2022-08-04: qty 1

## 2022-08-04 NOTE — ED Triage Notes (Signed)
Pt via POV from home. Pt LLQ abd pain, umbilical pain, and abd swelling states that she had an episode 5 days ago including nausea. States that it started getting worse this AM. Report dark urine and nausea. Pt has a hx of diverticulitis. Pt is A&OX4 and NAD

## 2022-08-04 NOTE — Discharge Instructions (Addendum)
For your constipation:  - Mix 8 cups/caps of Miralax in 32 oz of Gatorade or Juice and drink over the course of 24 hours - Start Colace/over-the-counter Docusate (stool softener only) twice a day - Take the Oxycodone for severe pain only - Use the glycerin or other NON STIMULANT suppository or enema for 1-2 days until BMs are soft and normal  IF symptoms do not resolve in 48 hr, take the Lactulose as prescribed  Avoid senna or other stimulant laxatives

## 2022-08-04 NOTE — ED Notes (Signed)
Dr Isaacs at bedside 

## 2022-08-04 NOTE — ED Provider Notes (Signed)
[facility] Provider Note    Event Date/Time   First MD Initiated Contact with Patient 08/04/22 1022     (approximate)   History   Abdominal Pain   HPI  Amber Booth is a 52 y.o. female  here with severe LLQ abdominal pain. Pt reports she has a h/o diverticulosis, s/p appy and cholecystectomy, and has had increasing LLQ abdominal pain over the past week. She has a h/o diverticulitis and thought it was that, but pain has worsened ot the point of making her pass out today due to severity. Pain is 10/10, constant, and band like across her lower abdomen. No urinary sx. No overt flank pain. H/o recurrent diverticulitis and IBS. No major recent med changes.       Physical Exam   Triage Vital Signs: ED Triage Vitals  Enc Vitals Group     BP 08/04/22 1012 124/89     Pulse Rate 08/04/22 1012 73     Resp 08/04/22 1012 (!) 27     Temp 08/04/22 1013 98 F (36.7 C)     Temp Source 08/04/22 1013 Oral     SpO2 08/04/22 1012 100 %     Weight 08/04/22 1006 170 lb (77.1 kg)     Height 08/04/22 1006 5' 8.5" (1.74 m)     Head Circumference --      Peak Flow --      Pain Score 08/04/22 1006 10     Pain Loc --      Pain Edu? --      Excl. in [place]? --     Most recent vital signs: Vitals:   08/04/22 1100 08/04/22 1425  BP: 127/89   Pulse: 76   Resp: 13   Temp:  98.2 F (36.8 C)  SpO2: 100%      General: Awake, no distress.  CV:  Good peripheral perfusion.  Resp:  Normal effort.  Abd:  No distention. Significant TTP with guarding across lower abdomen, worse on LLQ. Other:  No significant edema.   ED Results / Procedures / Treatments   Labs (all labs ordered are listed, but only abnormal results are displayed) Labs Reviewed  URINALYSIS, ROUTINE W REFLEX MICROSCOPIC - Abnormal; Notable for the following components:      Result Value   Color, Urine STRAW (*)    APPearance CLEAR (*)    Hgb urine dipstick SMALL (*)    Leukocytes,Ua SMALL (*)    All  other components within normal limits  URINALYSIS, ROUTINE W REFLEX MICROSCOPIC - Abnormal; Notable for the following components:   Color, Urine   (*)    Value: TEST NOT REPORTED DUE TO COLOR INTERFERENCE OF URINE PIGMENT   APPearance   (*)    Value: TEST NOT REPORTED DUE TO COLOR INTERFERENCE OF URINE PIGMENT   Glucose, UA   (*)    Value: TEST NOT REPORTED DUE TO COLOR INTERFERENCE OF URINE PIGMENT   Hgb urine dipstick   (*)    Value: TEST NOT REPORTED DUE TO COLOR INTERFERENCE OF URINE PIGMENT   Bilirubin Urine   (*)    Value: TEST NOT REPORTED DUE TO COLOR INTERFERENCE OF URINE PIGMENT   Ketones, ur   (*)    Value: TEST NOT REPORTED DUE TO COLOR INTERFERENCE OF URINE PIGMENT   Protein, ur   (*)    Value: TEST NOT REPORTED DUE TO COLOR INTERFERENCE OF URINE PIGMENT   Nitrite   (*)  Value: TEST NOT REPORTED DUE TO COLOR INTERFERENCE OF URINE PIGMENT   Leukocytes,Ua   (*)    Value: TEST NOT REPORTED DUE TO COLOR INTERFERENCE OF URINE PIGMENT   Bacteria, UA RARE (*)    All other components within normal limits  LIPASE, BLOOD  COMPREHENSIVE METABOLIC PANEL  CBC  LACTIC ACID, PLASMA     EKG Normal sinus rhythm, VR 68. PR 135, QRS 80, QTc 434. No acute ST elevations or depressions. No ischemia or infarct.   RADIOLOGY CT A/P: No acute abnormality   I also independently reviewed and agree with radiologist interpretations.   PROCEDURES:  Critical Care performed: No   MEDICATIONS ORDERED IN ED: Medications  HYDROmorphone (DILAUDID) injection 1 mg (1 mg Intravenous Given 08/04/22 1109)  ondansetron (ZOFRAN) injection 4 mg (4 mg Intravenous Given 08/04/22 1108)  sodium chloride 0.9 % bolus 1,000 mL (0 mLs Intravenous Stopped 08/04/22 1426)  iohexol (OMNIPAQUE) 300 MG/ML solution 100 mL (100 mLs Intravenous Contrast Given 08/04/22 1129)  HYDROmorphone (DILAUDID) injection 0.5 mg (0.5 mg Intravenous Given 08/04/22 1345)  dicyclomine (BENTYL) capsule 20 mg (20 mg Oral Given 08/04/22  1345)  ondansetron (ZOFRAN) injection 4 mg (4 mg Intravenous Given 08/04/22 1346)     IMPRESSION / MDM / ASSESSMENT AND PLAN / ED COURSE  I reviewed the triage vital signs and the nursing notes.                              Differential diagnosis includes, but is not limited to, diverticulitis, bowel obstruction, colitis, UTI, renal colic, IBS.  Patient's presentation is most consistent with acute presentation with potential threat to life or bodily function.  The patient is on the cardiac monitor to evaluate for evidence of arrhythmia and/or significant heart rate changes.  52 yo F here with abdominal pain, cramping. Clinically, suspect significant constipation based on CT scan with pain related to taking stimulant laxatives. CT scan o/w reviewed and shows no diverticulitis, colitis, or obstruction. Labs reassuring - normal WBC, normal LFTs and renal function, UA Negative for signs of UTI (mild interference from AZO). Will have her start a non-stimulant based bowel regimen, give brief analgesia, and d/c home.      FINAL CLINICAL IMPRESSION(S) / ED DIAGNOSES   Final diagnoses:  Abdominal cramping  Slow transit constipation     Rx / DC Orders   ED Discharge Orders          Ordered    ondansetron (ZOFRAN-ODT) 4 MG disintegrating tablet  Every 8 hours PRN        08/04/22 1353    dicyclomine (BENTYL) 20 MG tablet  3 times daily PRN        08/04/22 1353    oxyCODONE (ROXICODONE) 5 MG immediate release tablet  Every 8 hours PRN        08/04/22 1353    lactulose (CHRONULAC) 10 GM/15ML solution  3 times daily PRN        08/04/22 1353    glycerin adult 2 g suppository  As needed        08/04/22 1355             Note:  This document was prepared using Dragon voice recognition software and may include unintentional dictation errors.   Duffy Bruce, MD 08/04/22 2012

## 2022-10-03 ENCOUNTER — Ambulatory Visit
Admission: RE | Admit: 2022-10-03 | Discharge: 2022-10-03 | Disposition: A | Discharge status: Home / Self Care | Payer: 59 | Source: Ambulatory Visit | Attending: Family Medicine | Admitting: Family Medicine

## 2022-10-03 DIAGNOSIS — Z1231 Encounter for screening mammogram for malignant neoplasm of breast: Secondary | ICD-10-CM | POA: Diagnosis present

## 2022-10-11 ENCOUNTER — Other Ambulatory Visit: Payer: Self-pay

## 2022-10-11 ENCOUNTER — Emergency Department: Payer: 59

## 2022-10-11 ENCOUNTER — Emergency Department
Admission: EM | Admit: 2022-10-11 | Discharge: 2022-10-11 | Disposition: A | Discharge status: Home / Self Care | Payer: 59 | Attending: Emergency Medicine | Admitting: Emergency Medicine

## 2022-10-11 DIAGNOSIS — R1084 Generalized abdominal pain: Secondary | ICD-10-CM | POA: Insufficient documentation

## 2022-10-11 DIAGNOSIS — K5900 Constipation, unspecified: Secondary | ICD-10-CM | POA: Insufficient documentation

## 2022-10-11 LAB — COMPREHENSIVE METABOLIC PANEL
ALT: 51 U/L — ABNORMAL HIGH (ref 0–44)
AST: 31 U/L (ref 15–41)
Albumin: 4.4 g/dL (ref 3.5–5.0)
Alkaline Phosphatase: 116 U/L (ref 38–126)
Anion gap: 9 (ref 5–15)
BUN: 18 mg/dL (ref 6–20)
CO2: 23 mmol/L (ref 22–32)
Calcium: 9.4 mg/dL (ref 8.9–10.3)
Chloride: 101 mmol/L (ref 98–111)
Creatinine, Ser: 0.87 mg/dL (ref 0.44–1.00)
GFR, Estimated: >60 mL/min (ref >60)
Glucose, Bld: 99 mg/dL (ref 70–99)
Potassium: 4.7 mmol/L (ref 3.5–5.1)
Sodium: 133 mmol/L — ABNORMAL LOW (ref 135–145)
Total Bilirubin: 0.7 mg/dL (ref 0.3–1.2)
Total Protein: 7.6 g/dL (ref 6.5–8.1)

## 2022-10-11 LAB — URINALYSIS, ROUTINE W REFLEX MICROSCOPIC
Bilirubin Urine: NEGATIVE
Glucose, UA: NEGATIVE mg/dL
Hgb urine dipstick: NEGATIVE
Ketones, ur: 5 mg/dL — AB
Leukocytes,Ua: NEGATIVE
Nitrite: NEGATIVE
Protein, ur: 30 mg/dL — AB
Specific Gravity, Urine: 1.03 (ref 1.005–1.030)
pH: 5 (ref 5.0–8.0)

## 2022-10-11 LAB — CBC
HCT: 44.9 % (ref 36.0–46.0)
Hemoglobin: 14.9 g/dL (ref 12.0–15.0)
MCH: 29.6 pg (ref 26.0–34.0)
MCHC: 33.2 g/dL (ref 30.0–36.0)
MCV: 89.3 fL (ref 80.0–100.0)
Platelets: 295 10*3/uL (ref 150–400)
RBC: 5.03 MIL/uL (ref 3.87–5.11)
RDW: 12.7 % (ref 11.5–15.5)
WBC: 8.6 10*3/uL (ref 4.0–10.5)
nRBC: 0 % (ref 0.0–0.2)

## 2022-10-11 LAB — LIPASE, BLOOD: Lipase: 28 U/L (ref 11–51)

## 2022-10-11 MED ORDER — SODIUM CHLORIDE 0.9 % IV BOLUS
1000.0000 mL | Freq: Once | INTRAVENOUS | Status: AC
  Administered 2022-10-11: 1000 mL via INTRAVENOUS

## 2022-10-11 MED ORDER — POLYETHYLENE GLYCOL 3350 17 G PO PACK: 17.0000 g | PACK | Freq: Two times a day (BID) | ORAL | 0 refills | Status: AC

## 2022-10-11 MED ORDER — MORPHINE SULFATE (PF) 4 MG/ML IV SOLN
4.0000 mg | Freq: Once | INTRAVENOUS | Status: AC
  Administered 2022-10-11: 4 mg via INTRAVENOUS
  Filled 2022-10-11: qty 1

## 2022-10-11 MED ORDER — ONDANSETRON HCL 4 MG/2ML IJ SOLN
4.0000 mg | Freq: Once | INTRAMUSCULAR | Status: AC
  Administered 2022-10-11: 4 mg via INTRAVENOUS
  Filled 2022-10-11: qty 2

## 2022-10-11 MED ORDER — IOHEXOL 300 MG/ML  SOLN
100.0000 mL | Freq: Once | INTRAMUSCULAR | Status: AC | PRN
  Administered 2022-10-11: 100 mL via INTRAVENOUS

## 2022-10-11 NOTE — ED Provider Notes (Signed)
[facility] Provider Note    Event Date/Time   First MD Initiated Contact with Patient 10/11/22 1024     (approximate)   History   Abdominal Pain   HPI  Amber Booth is a 52 y.o. female   Past medical history of diverticulitis, kidney stones, status post hysterectomy, appendectomy, cholecystectomy who presents to the emergency department with abdominal pain generalized since yesterday.  Associated with constipation, had 1 hard stool yesterday, passing flatus today, no bowel movement for approximately 24 hours.  Associated with nausea but no vomiting.  She denies urinary symptoms and has had no vaginal discharge or bleeding.  She has had no fever or chills.  Independent Historian contributed to assessment above: Husband who is at bedside  External Medical Documents Reviewed: [facility] department visit dated February 2024 for abdominal pain and constipation with a negative CT scan ultimately discharged after pain control      Physical Exam   Triage Vital Signs: ED Triage Vitals  Enc Vitals Group     BP 10/11/22 1010 105/66     Pulse Rate 10/11/22 1010 89     Resp 10/11/22 1010 18     Temp 10/11/22 1010 98.6 F (37 C)     Temp src --      SpO2 10/11/22 1010 96 %     Weight 10/11/22 1008 165 lb (74.8 kg)     Height 10/11/22 1008 5\' 8"  (1.727 m)     Head Circumference --      Peak Flow --      Pain Score 10/11/22 1008 10     Pain Loc --      Pain Edu? --      Excl. in [place]? --     Most recent vital signs: Vitals:   10/11/22 1045 10/11/22 1100  BP:  120/78  Pulse:  81  Resp:    Temp:    SpO2: 100%     General: Awake, no distress.  CV:  Good peripheral perfusion.  Resp:  Normal effort.  Abd:  No distention.  Other:  Diffusely tender abdomen in all quadrants.  Vital signs within normal limits.  No fever.   ED Results / Procedures / Treatments   Labs (all labs ordered are listed, but only abnormal results are displayed) Labs  Reviewed  COMPREHENSIVE METABOLIC PANEL - Abnormal; Notable for the following components:      Result Value   Sodium 133 (*)    ALT 51 (*)    All other components within normal limits  URINALYSIS, ROUTINE W REFLEX MICROSCOPIC - Abnormal; Notable for the following components:   Color, Urine AMBER (*)    APPearance HAZY (*)    Ketones, ur 5 (*)    Protein, ur 30 (*)    Bacteria, UA RARE (*)    All other components within normal limits  LIPASE, BLOOD  CBC     I ordered and reviewed the above labs they are notable for normal white blood cell count normal bilirubin    RADIOLOGY I independently reviewed and interpreted the CT abdomen pelvis is no obvious obstructive or inflammatory changes   PROCEDURES:  Critical Care performed: No  Procedures   MEDICATIONS ORDERED IN ED: Medications  sodium chloride 0.9 % bolus 1,000 mL (0 mLs Intravenous Stopped 10/11/22 1225)  ondansetron (ZOFRAN) injection 4 mg (4 mg Intravenous Given 10/11/22 1036)  morphine (PF) 4 MG/ML injection 4 mg (4 mg Intravenous Given 10/11/22 1036)  iohexol (  OMNIPAQUE) 300 MG/ML solution 100 mL (100 mLs Intravenous Contrast Given 10/11/22 1124)     IMPRESSION / MDM / ASSESSMENT AND PLAN / ED COURSE  I reviewed the triage vital signs and the nursing notes.                                Patient's presentation is most consistent with acute presentation with potential threat to life or bodily function.  Differential diagnosis includes, but is not limited to, obstruction, diverticulitis, perforated viscus, retained stone/choledocholithiasis, constipation, urinary tract infection   The patient is on the cardiac monitor to evaluate for evidence of arrhythmia and/or significant heart rate changes.  MDM: This is a patient with generalized abdominal pain worsening since yesterday and tender abdomen will get a CT scan of the abdomen pelvis with IV contrast to assess for surgical pathologies like obstruction,  perforation, complicated diverticulitis.  Medicate with IV fluids, Zofran, morphine.  Less likely urinary tract infection given no urinary symptoms will obtain a urinalysis to check   I considered hospitalization for admission or observation however given symptoms improved with medications in the emergency department and workup as above is negative for acute emergent pathologies, did note some biliary dilation that is unchanged from prior and without a white blood cell count elevation, normal LFTs and normal bilirubin I doubt this is an acute biliary pathology.  She has follow-up with gastroenterologist I think outpatient follow-up and monitoring most appropriate this time.        FINAL CLINICAL IMPRESSION(S) / ED DIAGNOSES   Final diagnoses:  Generalized abdominal pain  Constipation, unspecified constipation type     Rx / DC Orders   ED Discharge Orders          Ordered    polyethylene glycol (MIRALAX) 17 g packet  2 times daily        10/11/22 1158    Ambulatory referral to Gastroenterology        10/11/22 1159             Note:  This document was prepared using Dragon voice recognition software and may include unintentional dictation errors.    Pilar Jarvis, MD 10/11/22 1233

## 2022-10-11 NOTE — ED Triage Notes (Signed)
Pt to ED for generalized abd pain started yesterday. +nausea. Reports pain radiates to both sides of back.  Hx diverticulitis

## 2022-10-11 NOTE — Discharge Instructions (Addendum)
Take MiraLAX 1 capfuls in the morning and 1 capfuls at night for constipation.  At this time, your blood testing and CT scan imaging did not show any emergencies that would require surgery or hospitalization.  However, continue to listen to your body and come back to the emergency department should you develop severe pain, bleeding, high fevers, or any new or unexpected symptoms.  Take acetaminophen 650 mg and ibuprofen 400 mg every 6 hours for pain.  Take with food.  Go to gastroenterology for further workup as scheduled.  I made a doctor to doctor referral to facilitate a follow-up appointment.  Thank you for choosing [place] for your health care today!  Please see your primary doctor this week for a follow up appointment.   Sometimes, in the early stages of certain disease courses it is difficult to detect in the emergency department evaluation -- so, it is important that you continue to monitor your symptoms and call your doctor right away or return to the emergency department if you develop any new or worsening symptoms.  Please go to the following website to schedule new (and existing) patient appointments:   [email or web address]  If you do not have a primary doctor try calling the following clinics to establish care:  If you have insurance:  [facility] [phone] [address] [place]., [place] [address]   [facility]  [phone] [address] [place] [address]   If you do not have insurance:  [facility]  [phone] [address] [place] [address]   The following is another list of primary care offices in the area who are accepting new patients at this time.  Please reach out to one of them directly and let them know you would like to schedule an appointment to follow up on an Emergency Department visit, and/or to establish a new primary care provider (PCP).  There are likely other primary care  clinics in the are who are accepting new patients, but this is an excellent place to start:  [facility] Lead physician: Dr Shirlee Latch [address] [place], [place] [address] [phone]  [facility] Lead Physician: Dr Alba Cory [address], [place], [place] [address] [phone]  [facility]  Lead Physician: Dr Olevia Perches [address] [place], [place] [address] [phone]  [facility] Lead Physician: Dr Sofie Hartigan [address] [place], [place], [place] [address] [phone]  [facility] Lead Physician: Dr Bari Edward [address] [place], [place] [address] [phone]   It was my pleasure to care for you today.   Daneil Dan Modesto Charon, MD

## 2022-12-03 ENCOUNTER — Other Ambulatory Visit: Payer: Self-pay | Admitting: Gastroenterology

## 2022-12-03 DIAGNOSIS — R748 Abnormal levels of other serum enzymes: Secondary | ICD-10-CM

## 2022-12-19 ENCOUNTER — Encounter: Payer: Self-pay | Admitting: Gastroenterology

## 2022-12-21 ENCOUNTER — Ambulatory Visit
Admission: RE | Admit: 2022-12-21 | Discharge: 2022-12-21 | Disposition: A | Discharge status: Home / Self Care | Payer: BC Managed Care – PPO | Source: Ambulatory Visit | Attending: Gastroenterology | Admitting: Gastroenterology

## 2022-12-21 DIAGNOSIS — R748 Abnormal levels of other serum enzymes: Secondary | ICD-10-CM

## 2022-12-21 MED ORDER — GADOPICLENOL 0.5 MMOL/ML IV SOLN
7.0000 mL | Freq: Once | INTRAVENOUS | Status: AC | PRN
  Administered 2022-12-21: 7 mL via INTRAVENOUS

## 2023-01-16 ENCOUNTER — Ambulatory Visit: Payer: BC Managed Care – PPO

## 2023-01-16 DIAGNOSIS — K5730 Diverticulosis of large intestine without perforation or abscess without bleeding: Secondary | ICD-10-CM | POA: Diagnosis not present

## 2023-01-16 DIAGNOSIS — Z800 Family history of malignant neoplasm of digestive organs: Secondary | ICD-10-CM | POA: Diagnosis not present

## 2023-01-16 DIAGNOSIS — Z1211 Encounter for screening for malignant neoplasm of colon: Secondary | ICD-10-CM | POA: Diagnosis present

## 2023-08-20 ENCOUNTER — Other Ambulatory Visit: Payer: Self-pay | Admitting: Family Medicine

## 2023-08-20 DIAGNOSIS — Z1231 Encounter for screening mammogram for malignant neoplasm of breast: Secondary | ICD-10-CM

## 2023-08-23 ENCOUNTER — Other Ambulatory Visit: Payer: Self-pay

## 2023-08-23 ENCOUNTER — Emergency Department: Payer: BC Managed Care – PPO

## 2023-08-23 ENCOUNTER — Emergency Department
Admission: EM | Admit: 2023-08-23 | Discharge: 2023-08-23 | Disposition: A | Discharge status: Home / Self Care | Payer: BC Managed Care – PPO | Attending: Emergency Medicine | Admitting: Emergency Medicine

## 2023-08-23 DIAGNOSIS — R519 Headache, unspecified: Secondary | ICD-10-CM | POA: Insufficient documentation

## 2023-08-23 DIAGNOSIS — R55 Syncope and collapse: Secondary | ICD-10-CM | POA: Insufficient documentation

## 2023-08-23 LAB — BASIC METABOLIC PANEL
Anion gap: 11 (ref 5–15)
BUN: 25 mg/dL — ABNORMAL HIGH (ref 6–20)
CO2: 24 mmol/L (ref 22–32)
Calcium: 9.5 mg/dL (ref 8.9–10.3)
Chloride: 103 mmol/L (ref 98–111)
Creatinine, Ser: 0.95 mg/dL (ref 0.44–1.00)
GFR, Estimated: >60 mL/min (ref >60)
Glucose, Bld: 94 mg/dL (ref 70–99)
Potassium: 4.3 mmol/L (ref 3.5–5.1)
Sodium: 138 mmol/L (ref 135–145)

## 2023-08-23 LAB — CBC
HCT: 40.4 % (ref 36.0–46.0)
Hemoglobin: 13.6 g/dL (ref 12.0–15.0)
MCH: 29.5 pg (ref 26.0–34.0)
MCHC: 33.7 g/dL (ref 30.0–36.0)
MCV: 87.6 fL (ref 80.0–100.0)
Platelets: 268 10*3/uL (ref 150–400)
RBC: 4.61 MIL/uL (ref 3.87–5.11)
RDW: 12.5 % (ref 11.5–15.5)
WBC: 7.8 10*3/uL (ref 4.0–10.5)
nRBC: 0 % (ref 0.0–0.2)

## 2023-08-23 LAB — TROPONIN I (HIGH SENSITIVITY): Troponin I (High Sensitivity): 3 ng/L (ref <18)

## 2023-08-23 MED ORDER — SODIUM CHLORIDE 0.9 % IV BOLUS
1000.0000 mL | Freq: Once | INTRAVENOUS | Status: AC
  Administered 2023-08-23: 1000 mL via INTRAVENOUS

## 2023-08-23 MED ORDER — METOCLOPRAMIDE HCL 5 MG/ML IJ SOLN
10.0000 mg | Freq: Once | INTRAMUSCULAR | Status: AC
  Administered 2023-08-23: 10 mg via INTRAVENOUS
  Filled 2023-08-23: qty 2

## 2023-08-23 MED ORDER — DIPHENHYDRAMINE HCL 50 MG/ML IJ SOLN
50.0000 mg | Freq: Once | INTRAMUSCULAR | Status: AC
  Administered 2023-08-23: 50 mg via INTRAVENOUS
  Filled 2023-08-23: qty 1

## 2023-08-23 MED ORDER — KETOROLAC TROMETHAMINE 30 MG/ML IJ SOLN
30.0000 mg | Freq: Once | INTRAMUSCULAR | Status: AC
  Administered 2023-08-23: 30 mg via INTRAVENOUS
  Filled 2023-08-23: qty 1

## 2023-08-23 MED ORDER — LORAZEPAM 2 MG/ML IJ SOLN
1.0000 mg | Freq: Once | INTRAMUSCULAR | Status: AC
  Administered 2023-08-23: 1 mg via INTRAVENOUS
  Filled 2023-08-23: qty 1

## 2023-08-23 NOTE — ED Provider Notes (Addendum)
 [facility] Provider Note    Event Date/Time   First MD Initiated Contact with Patient 08/23/23 1857     (approximate)  History   Chief Complaint: Loss of Consciousness  HPI  Amber Booth is a 53 y.o. female with a past medical history of migraine headaches, alpha 1 antitrypsin, anxiety, presents to the emergency department after a syncopal event and significant headache.  According to the patient and the husband patient has a long history of migraine headaches however since yesterday the patient states she has been experiencing a significant headache as well as fatigue and bodyaches.  Patient went to [facility] clinic today and while being seen had a brief syncopal episode.  Patient did take a new medication yesterday was started on naltrexone for weight loss as well as an increased dose of Wellbutrin.  Patient denies any weakness or numbness of any arm or leg.  Patient is complaining today of continued headache but states it feels somewhat different than her typical migraine.  Husband states patient was somewhat confused on arrival and was having trouble remembering where she was.  Patient has a history of complicated migraines in the past for time she will have a facial droop or other neurologic symptoms during her migraines.  Physical Exam   Triage Vital Signs: ED Triage Vitals  Encounter Vitals Group     BP 08/23/23 1730 (!) 129/91     Systolic BP Percentile --      Diastolic BP Percentile --      Pulse Rate 08/23/23 1730 93     Resp 08/23/23 1730 20     Temp 08/23/23 1730 97.6 F (36.4 C)     Temp Source 08/23/23 1730 Oral     SpO2 08/23/23 1730 100 %     Weight 08/23/23 1731 176 lb 8 oz (80.1 kg)     Height 08/23/23 1731 5\' 8"  (1.727 m)     Head Circumference --      Peak Flow --      Pain Score 08/23/23 1731 3     Pain Loc --      Pain Education --      Exclude from Growth Chart --     Most recent vital signs: Vitals:   08/23/23 1730 08/23/23  1940  BP: (!) 129/91 125/85  Pulse: 93 71  Resp: 20 18  Temp: 97.6 F (36.4 C)   SpO2: 100% 100%    General: Awake, no distress.  Speaking in a soft voice.  Keeps eyes closed for much of the exam. CV:  Good peripheral perfusion.  Regular rate and rhythm  Resp:  Normal effort.  Equal breath sounds bilaterally.  Abd:  No distention.  Soft, nontender.  ED Results / Procedures / Treatments   EKG  EKG viewed and interpreted by myself shows a normal sinus rhythm 81 bpm with a narrow QRS, normal axis, normal intervals, no concerning ST changes.  RADIOLOGY  I have reviewed interpret the CT head images.  No large bleed seen on my evaluation. Radiology is read the CT scan head is negative   MEDICATIONS ORDERED IN ED: Medications  sodium chloride 0.9 % bolus 1,000 mL (1,000 mLs Intravenous New Bag/Given 08/23/23 1938)  ketorolac (TORADOL) 30 MG/ML injection 30 mg (30 mg Intravenous Given 08/23/23 1941)  metoCLOPramide (REGLAN) injection 10 mg (10 mg Intravenous Given 08/23/23 1941)  diphenhydrAMINE (BENADRYL) injection 50 mg (50 mg Intravenous Given 08/23/23 1940)  LORazepam (ATIVAN) injection 1 mg (  1 mg Intravenous Given 08/23/23 2003)     IMPRESSION / MDM / ASSESSMENT AND PLAN / ED COURSE  I reviewed the triage vital signs and the nursing notes.  Patient's presentation is most consistent with acute presentation with potential threat to life or bodily function.  Patient presents to the emergency department for a migraine headache as well as a syncopal episode earlier today.  Overall the patient appears well she does appear to be somewhat uncomfortable with a migraine headache.  CT scan of the head shows no acute finding.  CBC is normal, chemistry is normal.  Patient has a long history of migraine headaches is on medications for her migraines, states often will have complicated migraines with neurologic findings at times.  However given the patient's syncopal event I have added on a cardiac  enzyme to the patient's workup.  Given her ongoing headache we will treat with Toradol Reglan Benadryl and IV fluids.  However given the patient's sensation of feeling confused earlier not knowing where she was we will obtain an MRI of the brain as a precaution to rule out any acute finding or CVA.  Patient and husband agreeable to plan of care.  MRI of the brain is normal.  Patient is currently resting comfortably reassuring labs including normal CBC chemistry and a troponin that was taken 3 hours after the event.  Patient had a reaction likely to the Reglan where she became very anxious and agitated.  Given 1 mg of IV Ativan and the patient was able to calm down and relax.  This also help the patient rest for the MRI.  MRI has resulted normal/negative.  Given the patient's reassuring workup in the emergency department reassuring labs CBC chemistry and troponin reassuring CT scan and MRI and as the patient states she is feeling much better after medications, no longer experiencing headache at this time.  Will discharge the patient home have the patient follow-up with her PCP.  Patient and husband agreeable to plan of care.  FINAL CLINICAL IMPRESSION(S) / ED DIAGNOSES   weakness migraine headache   Note:  This document was prepared using Dragon voice recognition software and may include unintentional dictation errors.   Minna Antis, MD 08/23/23 2213    Minna Antis, MD 08/23/23 2218

## 2023-08-23 NOTE — ED Triage Notes (Addendum)
 Pt to ED via POV from [facility]. Pt reports increased fatigue, HA and body aches since yesterday. [facility] brought pt over due to having syncopal episode. Pt A&Ox3 on arrival. Pt doesn't know where she is in triage. Pt drove herself to [facility] after work.   Pt reports did take a new medication yesterday and started to feel bad after

## 2023-09-02 ENCOUNTER — Other Ambulatory Visit: Payer: Self-pay | Admitting: Gastroenterology

## 2023-09-02 DIAGNOSIS — K831 Obstruction of bile duct: Secondary | ICD-10-CM

## 2023-09-02 DIAGNOSIS — K219 Gastro-esophageal reflux disease without esophagitis: Secondary | ICD-10-CM

## 2023-10-11 ENCOUNTER — Ambulatory Visit
Admission: RE | Admit: 2023-10-11 | Discharge: 2023-10-11 | Disposition: A | Discharge status: Home / Self Care | Payer: BC Managed Care – PPO | Source: Ambulatory Visit | Attending: Family Medicine | Admitting: Family Medicine

## 2023-10-11 DIAGNOSIS — Z1231 Encounter for screening mammogram for malignant neoplasm of breast: Secondary | ICD-10-CM | POA: Insufficient documentation

## 2023-10-21 ENCOUNTER — Other Ambulatory Visit: Payer: Self-pay | Admitting: Family Medicine

## 2023-10-21 DIAGNOSIS — R928 Other abnormal and inconclusive findings on diagnostic imaging of breast: Secondary | ICD-10-CM

## 2023-10-23 ENCOUNTER — Ambulatory Visit
Admission: RE | Admit: 2023-10-23 | Discharge: 2023-10-23 | Disposition: A | Discharge status: Home / Self Care | Source: Ambulatory Visit | Attending: Family Medicine | Admitting: Family Medicine

## 2023-10-23 ENCOUNTER — Other Ambulatory Visit: Payer: Self-pay | Admitting: Family Medicine

## 2023-10-23 DIAGNOSIS — R928 Other abnormal and inconclusive findings on diagnostic imaging of breast: Secondary | ICD-10-CM
